# Patient Record
Sex: Female | Born: 1945 | Race: White | Hispanic: No | Smoking: Former smoker
Health system: Southern US, Community
[De-identification: ages and names within clinical notes are randomized; demographics above are authoritative.]

## PROBLEM LIST (undated history)

## (undated) DIAGNOSIS — I739 Peripheral vascular disease, unspecified: Secondary | ICD-10-CM

## (undated) DIAGNOSIS — K5790 Diverticulosis of intestine, part unspecified, without perforation or abscess without bleeding: Secondary | ICD-10-CM

## (undated) DIAGNOSIS — I10 Essential (primary) hypertension: Secondary | ICD-10-CM

## (undated) DIAGNOSIS — Z9981 Dependence on supplemental oxygen: Secondary | ICD-10-CM

## (undated) DIAGNOSIS — J449 Chronic obstructive pulmonary disease, unspecified: Secondary | ICD-10-CM

## (undated) DIAGNOSIS — E119 Type 2 diabetes mellitus without complications: Secondary | ICD-10-CM

## (undated) DIAGNOSIS — I2510 Atherosclerotic heart disease of native coronary artery without angina pectoris: Secondary | ICD-10-CM

## (undated) DIAGNOSIS — F419 Anxiety disorder, unspecified: Secondary | ICD-10-CM

## (undated) DIAGNOSIS — I219 Acute myocardial infarction, unspecified: Secondary | ICD-10-CM

## (undated) DIAGNOSIS — R0600 Dyspnea, unspecified: Secondary | ICD-10-CM

## (undated) DIAGNOSIS — E785 Hyperlipidemia, unspecified: Secondary | ICD-10-CM

## (undated) HISTORY — DX: Hyperlipidemia, unspecified: E78.5

## (undated) HISTORY — PX: CORONARY ANGIOPLASTY: SHX604

## (undated) HISTORY — PX: CORONARY ARTERY BYPASS GRAFT: SHX141

## (undated) HISTORY — DX: Chronic obstructive pulmonary disease, unspecified: J44.9

## (undated) HISTORY — DX: Peripheral vascular disease, unspecified: I73.9

## (undated) HISTORY — PX: ABDOMINAL HYSTERECTOMY: SHX81

## (undated) HISTORY — DX: Type 2 diabetes mellitus without complications: E11.9

## (undated) HISTORY — PX: CAROTID ENDARTERECTOMY: SUR193

## (undated) HISTORY — DX: Acute myocardial infarction, unspecified: I21.9

## (undated) HISTORY — DX: Essential (primary) hypertension: I10

## (undated) HISTORY — PX: TUBAL LIGATION: SHX77

## (undated) HISTORY — PX: HEMORRHOID SURGERY: SHX153

---

## 1998-08-10 DIAGNOSIS — I219 Acute myocardial infarction, unspecified: Secondary | ICD-10-CM

## 1998-08-10 HISTORY — DX: Acute myocardial infarction, unspecified: I21.9

## 2006-05-26 ENCOUNTER — Ambulatory Visit: Payer: Self-pay | Admitting: Family Medicine

## 2007-03-08 ENCOUNTER — Ambulatory Visit: Payer: Self-pay | Admitting: Internal Medicine

## 2007-03-10 ENCOUNTER — Ambulatory Visit: Payer: Self-pay | Admitting: Gastroenterology

## 2007-03-12 ENCOUNTER — Inpatient Hospital Stay: Payer: Self-pay | Admitting: Unknown Physician Specialty

## 2007-03-12 ENCOUNTER — Other Ambulatory Visit: Payer: Self-pay

## 2007-03-15 ENCOUNTER — Ambulatory Visit: Payer: Self-pay | Admitting: Gastroenterology

## 2007-04-20 ENCOUNTER — Ambulatory Visit: Payer: Self-pay | Admitting: Family Medicine

## 2007-06-02 ENCOUNTER — Ambulatory Visit: Payer: Self-pay | Admitting: Family Medicine

## 2009-07-01 ENCOUNTER — Emergency Department: Payer: Self-pay | Admitting: Emergency Medicine

## 2010-11-19 ENCOUNTER — Emergency Department: Payer: Self-pay | Admitting: Emergency Medicine

## 2011-02-03 ENCOUNTER — Ambulatory Visit: Payer: Self-pay | Admitting: Family Medicine

## 2011-02-06 ENCOUNTER — Ambulatory Visit: Payer: Self-pay | Admitting: Family Medicine

## 2011-03-05 ENCOUNTER — Ambulatory Visit: Payer: Self-pay | Admitting: Surgery

## 2012-02-19 DIAGNOSIS — I10 Essential (primary) hypertension: Secondary | ICD-10-CM | POA: Insufficient documentation

## 2012-02-19 DIAGNOSIS — Z720 Tobacco use: Secondary | ICD-10-CM | POA: Insufficient documentation

## 2012-02-19 DIAGNOSIS — E669 Obesity, unspecified: Secondary | ICD-10-CM | POA: Insufficient documentation

## 2012-02-19 DIAGNOSIS — I2510 Atherosclerotic heart disease of native coronary artery without angina pectoris: Secondary | ICD-10-CM | POA: Insufficient documentation

## 2012-02-19 DIAGNOSIS — I739 Peripheral vascular disease, unspecified: Secondary | ICD-10-CM | POA: Insufficient documentation

## 2012-02-19 DIAGNOSIS — E785 Hyperlipidemia, unspecified: Secondary | ICD-10-CM | POA: Insufficient documentation

## 2012-03-01 ENCOUNTER — Ambulatory Visit: Payer: Self-pay | Admitting: Family Medicine

## 2012-06-24 ENCOUNTER — Ambulatory Visit: Payer: Self-pay | Admitting: Family Medicine

## 2012-07-28 ENCOUNTER — Inpatient Hospital Stay: Payer: Self-pay | Admitting: Internal Medicine

## 2012-07-28 LAB — COMPREHENSIVE METABOLIC PANEL
Alkaline Phosphatase: 91 U/L (ref 50–136)
Anion Gap: 8 (ref 7–16)
Calcium, Total: 9 mg/dL (ref 8.5–10.1)
Co2: 33 mmol/L — ABNORMAL HIGH (ref 21–32)
EGFR (African American): 35 — ABNORMAL LOW
EGFR (Non-African Amer.): 30 — ABNORMAL LOW
Glucose: 121 mg/dL — ABNORMAL HIGH (ref 65–99)
Osmolality: 282 (ref 275–301)
SGOT(AST): 39 U/L — ABNORMAL HIGH (ref 15–37)
SGPT (ALT): 23 U/L (ref 12–78)
Sodium: 136 mmol/L (ref 136–145)

## 2012-07-28 LAB — CBC
HCT: 38.7 % (ref 35.0–47.0)
MCH: 28.3 pg (ref 26.0–34.0)
MCV: 81 fL (ref 80–100)
Platelet: 191 10*3/uL (ref 150–440)
RBC: 4.77 10*6/uL (ref 3.80–5.20)
RDW: 16.6 % — ABNORMAL HIGH (ref 11.5–14.5)
WBC: 5.8 10*3/uL (ref 3.6–11.0)

## 2012-07-28 LAB — CK TOTAL AND CKMB (NOT AT ARMC)
CK, Total: 262 U/L — ABNORMAL HIGH (ref 21–215)
CK-MB: <0.5 ng/mL — ABNORMAL LOW (ref 0.5–3.6)

## 2012-07-28 LAB — TROPONIN I: Troponin-I: 0.24 ng/mL — ABNORMAL HIGH

## 2012-07-29 LAB — CBC WITH DIFFERENTIAL/PLATELET
Basophil #: 0 10*3/uL (ref 0.0–0.1)
Basophil %: 0.2 %
Eosinophil #: 0.1 10*3/uL (ref 0.0–0.7)
Eosinophil %: 1.6 %
HCT: 34.8 % — ABNORMAL LOW (ref 35.0–47.0)
HGB: 11.8 g/dL — ABNORMAL LOW (ref 12.0–16.0)
Lymphocyte #: 2.1 10*3/uL (ref 1.0–3.6)
MCH: 27.5 pg (ref 26.0–34.0)
MCHC: 34 g/dL (ref 32.0–36.0)
MCV: 81 fL (ref 80–100)
Monocyte #: 0.6 x10 3/mm (ref 0.2–0.9)
Neutrophil %: 53.1 %
Platelet: 162 10*3/uL (ref 150–440)
RBC: 4.3 10*6/uL (ref 3.80–5.20)

## 2012-07-29 LAB — BASIC METABOLIC PANEL
Anion Gap: 6 — ABNORMAL LOW (ref 7–16)
BUN: 35 mg/dL — ABNORMAL HIGH (ref 7–18)
Calcium, Total: 8.5 mg/dL (ref 8.5–10.1)
Chloride: 99 mmol/L (ref 98–107)
Co2: 33 mmol/L — ABNORMAL HIGH (ref 21–32)
EGFR (African American): 40 — ABNORMAL LOW
Osmolality: 285 (ref 275–301)
Potassium: 3.5 mmol/L (ref 3.5–5.1)

## 2012-07-29 LAB — TROPONIN I: Troponin-I: 0.23 ng/mL — ABNORMAL HIGH

## 2012-07-29 LAB — APTT: Activated PTT: 141.2 secs — ABNORMAL HIGH (ref 23.6–35.9)

## 2012-07-29 LAB — LIPID PANEL: HDL Cholesterol: 31 mg/dL — ABNORMAL LOW (ref 40–60)

## 2012-07-29 LAB — CK-MB: CK-MB: 0.6 ng/mL (ref 0.5–3.6)

## 2012-07-30 LAB — BASIC METABOLIC PANEL
Anion Gap: 7 (ref 7–16)
BUN: 21 mg/dL — ABNORMAL HIGH (ref 7–18)
Chloride: 103 mmol/L (ref 98–107)
Creatinine: 1.25 mg/dL (ref 0.60–1.30)
EGFR (Non-African Amer.): 45 — ABNORMAL LOW
Glucose: 151 mg/dL — ABNORMAL HIGH (ref 65–99)
Osmolality: 285 (ref 275–301)
Potassium: 3.5 mmol/L (ref 3.5–5.1)
Sodium: 140 mmol/L (ref 136–145)

## 2012-07-30 LAB — CBC WITH DIFFERENTIAL/PLATELET
Basophil %: 0.1 %
Eosinophil %: 0 %
HCT: 34.6 % — ABNORMAL LOW (ref 35.0–47.0)
Lymphocyte #: 1.1 10*3/uL (ref 1.0–3.6)
MCH: 27 pg (ref 26.0–34.0)
MCV: 81 fL (ref 80–100)
Monocyte #: 0.3 x10 3/mm (ref 0.2–0.9)
Monocyte %: 5.8 %
Neutrophil #: 3.7 10*3/uL (ref 1.4–6.5)
Neutrophil %: 72.6 %
Platelet: 160 10*3/uL (ref 150–440)

## 2012-08-03 LAB — CULTURE, BLOOD (SINGLE)

## 2012-09-30 ENCOUNTER — Ambulatory Visit: Payer: Self-pay | Admitting: Specialist

## 2013-02-14 DIAGNOSIS — N289 Disorder of kidney and ureter, unspecified: Secondary | ICD-10-CM | POA: Insufficient documentation

## 2013-04-17 ENCOUNTER — Emergency Department: Payer: Self-pay | Admitting: Emergency Medicine

## 2013-09-01 LAB — CBC WITH DIFFERENTIAL/PLATELET
BASOS ABS: 0.2 10*3/uL — AB (ref 0.0–0.1)
BASOS PCT: 2.3 %
EOS ABS: 0.1 10*3/uL (ref 0.0–0.7)
EOS PCT: 1.7 %
HCT: 40.8 % (ref 35.0–47.0)
HGB: 13.6 g/dL (ref 12.0–16.0)
Lymphocyte #: 3.5 10*3/uL (ref 1.0–3.6)
Lymphocyte %: 42.2 %
MCH: 28.8 pg (ref 26.0–34.0)
MCHC: 33.5 g/dL (ref 32.0–36.0)
MCV: 86 fL (ref 80–100)
Monocyte #: 0.9 x10 3/mm (ref 0.2–0.9)
Monocyte %: 10.4 %
Neutrophil #: 3.6 10*3/uL (ref 1.4–6.5)
Neutrophil %: 43.4 %
Platelet: 269 10*3/uL (ref 150–440)
RBC: 4.73 10*6/uL (ref 3.80–5.20)
RDW: 16.3 % — AB (ref 11.5–14.5)
WBC: 8.2 10*3/uL (ref 3.6–11.0)

## 2013-09-01 LAB — URINALYSIS, COMPLETE
Bacteria: NONE SEEN
Bilirubin,UR: NEGATIVE
Blood: NEGATIVE
Glucose,UR: NEGATIVE mg/dL (ref 0–75)
Ketone: NEGATIVE
Leukocyte Esterase: NEGATIVE
NITRITE: NEGATIVE
PH: 6 (ref 4.5–8.0)
PROTEIN: NEGATIVE
RBC,UR: 1 /HPF (ref 0–5)
Specific Gravity: 1.008 (ref 1.003–1.030)
WBC UR: 1 /HPF (ref 0–5)

## 2013-09-01 LAB — BASIC METABOLIC PANEL
Anion Gap: 4 — ABNORMAL LOW (ref 7–16)
BUN: 34 mg/dL — ABNORMAL HIGH (ref 7–18)
CHLORIDE: 101 mmol/L (ref 98–107)
CO2: 35 mmol/L — AB (ref 21–32)
Calcium, Total: 8.9 mg/dL (ref 8.5–10.1)
Creatinine: 1.84 mg/dL — ABNORMAL HIGH (ref 0.60–1.30)
GFR CALC AF AMER: 33 — AB
GFR CALC NON AF AMER: 28 — AB
Glucose: 112 mg/dL — ABNORMAL HIGH (ref 65–99)
OSMOLALITY: 288 (ref 275–301)
POTASSIUM: 3.4 mmol/L — AB (ref 3.5–5.1)
Sodium: 140 mmol/L (ref 136–145)

## 2013-09-01 LAB — TROPONIN I
TROPONIN-I: 0.02 ng/mL
Troponin-I: <0.02 ng/mL

## 2013-09-02 ENCOUNTER — Observation Stay: Payer: Self-pay | Admitting: Internal Medicine

## 2013-10-04 ENCOUNTER — Ambulatory Visit: Payer: Self-pay | Admitting: Family Medicine

## 2014-08-14 DIAGNOSIS — J449 Chronic obstructive pulmonary disease, unspecified: Secondary | ICD-10-CM | POA: Diagnosis not present

## 2014-08-27 DIAGNOSIS — J449 Chronic obstructive pulmonary disease, unspecified: Secondary | ICD-10-CM | POA: Diagnosis not present

## 2014-09-11 DIAGNOSIS — E785 Hyperlipidemia, unspecified: Secondary | ICD-10-CM | POA: Diagnosis not present

## 2014-09-11 DIAGNOSIS — I10 Essential (primary) hypertension: Secondary | ICD-10-CM | POA: Diagnosis not present

## 2014-09-11 DIAGNOSIS — R0602 Shortness of breath: Secondary | ICD-10-CM | POA: Diagnosis not present

## 2014-09-11 DIAGNOSIS — R350 Frequency of micturition: Secondary | ICD-10-CM | POA: Diagnosis not present

## 2014-09-14 DIAGNOSIS — J449 Chronic obstructive pulmonary disease, unspecified: Secondary | ICD-10-CM | POA: Diagnosis not present

## 2014-09-27 DIAGNOSIS — J449 Chronic obstructive pulmonary disease, unspecified: Secondary | ICD-10-CM | POA: Diagnosis not present

## 2014-10-04 DIAGNOSIS — E780 Pure hypercholesterolemia: Secondary | ICD-10-CM | POA: Diagnosis not present

## 2014-10-04 DIAGNOSIS — J449 Chronic obstructive pulmonary disease, unspecified: Secondary | ICD-10-CM | POA: Insufficient documentation

## 2014-10-04 DIAGNOSIS — I10 Essential (primary) hypertension: Secondary | ICD-10-CM | POA: Diagnosis not present

## 2014-10-04 DIAGNOSIS — I25119 Atherosclerotic heart disease of native coronary artery with unspecified angina pectoris: Secondary | ICD-10-CM | POA: Diagnosis not present

## 2014-10-04 DIAGNOSIS — I739 Peripheral vascular disease, unspecified: Secondary | ICD-10-CM | POA: Diagnosis not present

## 2014-10-05 ENCOUNTER — Ambulatory Visit: Payer: Self-pay | Admitting: Family Medicine

## 2014-10-05 DIAGNOSIS — Z1231 Encounter for screening mammogram for malignant neoplasm of breast: Secondary | ICD-10-CM | POA: Diagnosis not present

## 2014-10-13 DIAGNOSIS — J449 Chronic obstructive pulmonary disease, unspecified: Secondary | ICD-10-CM | POA: Diagnosis not present

## 2014-10-26 DIAGNOSIS — J449 Chronic obstructive pulmonary disease, unspecified: Secondary | ICD-10-CM | POA: Diagnosis not present

## 2014-11-07 DIAGNOSIS — N289 Disorder of kidney and ureter, unspecified: Secondary | ICD-10-CM | POA: Diagnosis not present

## 2014-11-07 DIAGNOSIS — Z951 Presence of aortocoronary bypass graft: Secondary | ICD-10-CM | POA: Diagnosis not present

## 2014-11-07 DIAGNOSIS — Z720 Tobacco use: Secondary | ICD-10-CM | POA: Diagnosis not present

## 2014-11-07 DIAGNOSIS — I517 Cardiomegaly: Secondary | ICD-10-CM | POA: Diagnosis not present

## 2014-11-07 DIAGNOSIS — J449 Chronic obstructive pulmonary disease, unspecified: Secondary | ICD-10-CM | POA: Diagnosis not present

## 2014-11-07 DIAGNOSIS — J9611 Chronic respiratory failure with hypoxia: Secondary | ICD-10-CM | POA: Insufficient documentation

## 2014-11-13 DIAGNOSIS — J449 Chronic obstructive pulmonary disease, unspecified: Secondary | ICD-10-CM | POA: Diagnosis not present

## 2014-11-26 DIAGNOSIS — J449 Chronic obstructive pulmonary disease, unspecified: Secondary | ICD-10-CM | POA: Diagnosis not present

## 2014-11-27 NOTE — Discharge Summary (Signed)
PATIENT NAME:  [name], [name] MR#:  [id number] DATE OF BIRTH:  1945/11/21  DATE OF ADMISSION:  07/28/2012 DATE OF DISCHARGE:  07/30/2012  CHIEF COMPLAINT: Chest pain.   CONSULTANT: Dr. Ubaldo Glassing from cardiology.   PRIMARY CARE PHYSICIAN: Dr. Kary Kos, primary cardiologist at [facility].   DISCHARGE DIAGNOSES: 1.  Non-ST elevation myocardial infarction.  2. Mild wheezing, likely from bronchitis/mild chronic obstructive pulmonary disease exacerbation.  3.  Hypertension.  4.  History of chronic obstructive pulmonary disease, on oxygen.  5.  Coronary artery disease, status post coronary artery bypass graft with stent placements in the past.  6.  Hyperlipidemia.  7.  Peripheral vascular disease.   DISCHARGE MEDICATIONS: 1.  Clopidogrel 75 mg 1 tab daily.  2.  Torsemide 20 mg daily.  3.  Aspirin 81 mg daily.  4.  Advair 100 mcg/25 mcg inhaled powder 1 puff once a day.  5.  Spiriva 18 mcg inhaled 1 cap daily.  6.  Vitamin D3, a 50,000 international units oral capsule 1 cap once a day.  7.  Folic acid 0.8 mg 1 cap once a day. 8.  Cymbalta 60 mg daily. 9.  Fish oil 1000 mg daily 1 cap.  10.  Metoprolol succinate 200 mg 1 tab once a day.  11.  Omega Krill 300 mg once a day.  12.  Ferrous sulfate 325 mg once a day.  13.  Imdur 30 mg 1 tab once a day.  14.  Nitroglycerin sublingual p.r.n., 0.4 mg, 1 tab as needed every five minutes x 3 for chest pain.  15.  Simvastatin 20 mg once a day.  16.  Levaquin 500 mg 1 tab every 24 hours.   DISCHARGE INSTRUCTIONS:  The patient will be going home with 2 liters nasal cannula. Low sodium, low fat, low cholesterol diet. Follow with your pulmonologist and cardiologist within 1 to 2 weeks. Please follow with primary care physician within 1 to 2 weeks.   HISTORY OF PRESENT ILLNESS: For full details of the history and physical, please see the dictation on the 19th by Dr. Margaretmary Eddy.  This is a [name] with the above chief complaint who has history of  CAD status post bypass in 1992 at [facility]; COPD, on 2 liters of oxygen on p.r.n. basis, hypertension, hyperlipidemia, who presented to the hospital. She also did have some productive cough, was bringing up some clear sputum. She also had some low-grade fevers. On arrival, she was noted to have elevated troponin and there were some EKG changes on admission. The case was discussed with cardiology, Dr. Ubaldo Glassing, and the patient was admitted to the hospitalist service.   SIGNIFICANT LABS AND IMAGING: Initial creatinine 1.75, by discharge 1.25; initial BUN 38, initial potassium 2.9, chloride 95. Initial troponin 0.24, then 0.23, then 0.17. Initial CK total was 262 and CK-MBs were not elevated x 3. Initial WBC 5.8, hemoglobin 13.5. Blood cultures no growth to date. The patient underwent cardiac catheterization on July 29, 2012, which showed multivessel disease with proximal LAD there was  100% stenosis, distal LAD moderate atherosclerosis, proximal circ 70% stenosis at the site of prior stent, first obtuse marginal 70% stenosis and second lesion there is 70% stenosis, proximal RCA there is 100% stenosis, 100% stenosis at the proximal anastomosis graft. There is 100% stenosis at the proximal anastomosis on the graft to the RPDA. X-ray of the chest, PA and lateral, on arrival showing interstitial marking of both lungs are increased coupled with the appearance of cardiac silhouette  and pulmonary vascular congestion suggesting low-grade CHF. No focal pneumonia but cannot exclude acute bronchitis; possible underlying COPD suspected.   HOSPITAL COURSE: The patient was admitted to the hospitalist service, started on aspirin and Plavix, her beta blocker was continued. She was admitted to CCU. The patient was seen by Dr. Ubaldo Glassing from cardiology and the patient underwent a cardiac cath, the results of which are above. The images were shared with the physicians at [facility] and at this point medical management is recommended. LV gram not  done secondary to renal function. The patient appears to have CKD  at least stage III. She did not have any further chest pains after the night of admission. Her LDL goal has been met, her LDL is 57. Therefore, she was started on Imdur 30 mg. Her other medications will be continued. She should follow with her primary cardiologist for further intervention and evaluation. She did have mild wheezing on exam with recent URI symptoms, was started on the Levaquin. She does not have pneumonia but she likely has bronchitis with  mild COPD exacerbation and was also given several doses of IV Solu-Medrol. Her symptoms are better and she was encouraged to wear her oxygen in the near future to increase oxygen supplied to the myocardium. At this point, she will be discharged with outpatient followup.     ____________________________ Vivien Presto, MD sa:cs D: 07/30/2012 13:41:00 ET T: 07/31/2012 19:37:44 ET JOB#: [id number]  cc: Vivien Presto, MD, <Dictator> Irven Easterly. Kary Kos, MD Vivien Presto MD ELECTRONICALLY SIGNED 08/09/2012 14:12

## 2014-11-27 NOTE — H&P (Signed)
PATIENT NAME:  [name], [name] MR#:  [id number] DATE OF BIRTH:  1946-04-20  DATE OF ADMISSION:  07/28/2012  PRIMARY CARE PHYSICIAN: Jerl MinaJames Hedrick, MD.   PRIMARY CARDIOLOGIST: At [facility]. The patient was seen by Dr. Lady GaryFath at [facility] in the past as well.   CHIEF COMPLAINT: Shortness of breath, cough and chest tightness.   HISTORY OF PRESENT ILLNESS: The patient is a 69 year old Caucasian female with a past medical history of coronary artery disease, status post coronary artery [facility] and recent history of stent placement at [facility] 3 years ago, COPD, 2 liters of oxygen dependent on as needed basis, hypertension, hyperlipidemia and peripheral vascular disease with 2 stent placement in the past, is presenting to the ER with a chief complaint of shortness of breath associated with chest tightness in the middle of the chest since Sunday. This is associated with a productive cough, and she is bringing up clear phlegm. She denies any sick contacts, but she feels like she has a low-grade temperature. She did not get any flu shot this year. The patient tried over-the-counter medications for cold and cough with no significant improvement. Today, the chest tightness became worse and the patient feels like there is somebody sitting in the middle of her chest, associated with some diaphoresis and lightheadedness. Eventually, the patient came into the ER. An initial chest x-ray has revealed early developing infiltrate in the right lower lobe. Cardiac enzymes are elevated. Initial troponin was at 0.24. A 12-lead EKG has revealed ST depressions in the inferior leads II, III and aVF. The patient has received 1 breathing treatment, as well as Levaquin IV after blood cultures were obtained in the ER. The patient was also started on heparin drip after given bolus in the ER. Hospitalist team is called to admit the patient for NSTEMI, as well as early developing  pneumonia. During my examination, the patient started feeling better and reporting that her chest tightness is significantly improved and she did not feel any chest pain. Shortness of breath is much better during my examination. The patient's son and her boyfriend are at bedside during my exam. The patient is aware of the elevated troponin and EKG changes and felt comfortable. Will stay at [facility], though initially they were thinking of getting [facility]. I have called Dr. Lady GaryFath, cardiology group, and notified about the patient's clinical condition as well as lab work.    PAST MEDICAL HISTORY: Coronary artery disease status post coronary artery [facility] and 2 stent placement 3 years ago. Hypertension, hyperlipidemia, COPD O2 dependent. She uses 2 liters of oxygen on an as needed basis. Peripheral vascular disease.   PAST SURGICAL HISTORY: Status post coronary artery bypass grafting in 1992, status post 2 stent [facility] years ago, peripheral vascular disease stent placement a few years ago, hysterectomy for fibroids.   ALLERGIES: She has no known drug allergies.   HOME MEDICATIONS: Torsemide 20 mg once daily for swelling (denies any congestive heart failure), Spiriva 18 mcg 1 puff inhalation once a day, metoprolol succinate 200 mg once a day, folic acid 0.8 mg p.o. once a day, fish oil 1000 mg once a day, iron sulfate 325 mg once a day, vitamin D3 50,000 international units 1 capsule once a day, Cymbalta 60 mg once a day, clopidogrel 75 mg 1 tablet once a day, aspirin 81 mg once a day.  PSYCHOSOCIAL HISTORY: Lives at home with boyfriend and pet. She used to smoke but quit smoking in October 2013. Denies any alcohol or illicit drug usage.   FAMILY HISTORY: Dad had history of heart attack at age 56, also he had history of hypertension.   REVIEW OF SYSTEMS:  CONSTITUTIONAL: Complaining of low-grade fever,  weakness but denies any weight loss or weight gain.  EYES: Denies any blurry vision, glaucoma, cataracts, redness.  ENT: Denies tinnitus, ear pain, ear discharge. Complaining of nostril stuffiness. Denies any snoring. Denies any postnasal drip. Denies any difficulty swallowing.  RESPIRATORY: Positive productive cough. Denies any wheezing or hemoptysis. Chronic history of COPD. Complaining of shortness of breath. She denies asthma or painful respiration.  CARDIOVASCULAR: Denies any chest pain during my examination. Denies palpitations, syncope, varicose veins or edema.  GASTROINTESTINAL: Denies nausea, vomiting, diarrhea, abdominal pain or melena. No hematemesis.  GENITOURINARY: Denies dysuria, hematuria, renal calculus.  GYNECOLOGY: Denies any breast mass, vaginal discharge. The patient has history of hysterectomy.  ENDOCRINE: Denies polyuria, polyphagia, polydipsia. Denies any thyroid problems. Denies any increased sweating.  HEMATOLOGIC AND LYMPHATIC: Denies any lymph node swelling. Denies any anemia.  INTEGUMENTARY: Denies acne, rash, lesions.  MUSCULOSKELETAL: Denies any pain in the neck, back, shoulder, arthritis, gout.  NEUROLOGIC: Denies any vertigo, ataxia, weakness, dysarthria, dementia, migraine headache.  PSYCHIATRIC: Denies any anxiety, insomnia, bipolar disorder, obsessive-compulsive disorder.   PHYSICAL EXAMINATION:  VITAL SIGNS: Temperature 98.5, pulse 63, respiratory rate 24, blood pressure 113/68, pulse oximetry 92% to 93% on 2 liters.  GENERAL APPEARANCE: Not in acute distress, moderately built, moderately nourished, answering questions appropriately.  HEENT: Normocephalic, atraumatic. Pupils are equally reacting to light and accommodation. Extraocular movements are intact. No conjunctival injection. No scleral icterus. Nares are congested. No postnasal drip. No sinus tenderness. No ear discharge. Tympanic membranes are intact.  NECK: Supple. No JVD. No thyromegaly. The patient  has left-sided carotid endarterectomy scar, healed well. No carotid bruit is heard on the right.  LUNGS: Clear to auscultation bilaterally. No accessory muscle usage. No wheezing. No chest wall tenderness.  CARDIOVASCULAR: S1, S2 normal. Regular rate and rhythm. No pedal edema. Peripheral pulses are 2+.  GASTROINTESTINAL: Soft. Bowel sounds are positive in all 4 quadrants. Nontender, nondistended. No masses felt. No rebound tenderness.  NEUROLOGIC: Awake, alert and oriented x3. Cranial nerves II through XII grossly intact. Motor and sensory are intact. Reflexes are 2+.  SKIN: No lesions. No acne. No rashes.  MUSCULOSKELETAL: No joint effusion, tenderness or erythema noticed. Range of motion is grossly intact.  EXTREMITIES: No cyanosis. No edema. No clubbing.   LABS AND IMAGING STUDIES: A 12-lead EKG revealed ST depressions in leads II, III and aVF. PR interval is prolonged at 224 ms which is indicating first-degree AV block. Chest x-ray: Developing early right lower lobe infiltrate. Glucose is 121, sodium 136, potassium 2.9, chloride 95, CO2 33. GFR is 30. Anion gap is 8. Osmolality 282. BUN 38, creatinine 1.75. Albumin 3.1, bilirubin total 2.7, alkaline phosphatase 91, AST 39, ALT is 23. CK total 262. CPK-MB less than 0.5. Troponin 0.24. WBC 5.8, hemoglobin 13.1, hematocrit 38.7, platelet count is 191,000. Activated PTT 30.4.   ASSESSMENT AND PLAN: A 69 year old Caucasian female presenting to the ER with a 3 day history of chest tightness, shortness of breath and cough. Will be admitted with the following assessment and plan.  1. Chest tightness with elevated troponin and ST depressions in the inferior leads - Non-ST elevation myocardial infarction (NSTEMI). Admit to intensive care  unit. N.p.o. and provide her intravenous fluids. Acute coronary syndrome (ACS) protocol with oxygen, nitroglycerin, aspirin, beta blocker and statin. Will get 2-D echocardiogram. The patient is started on a heparin bolus  followed by a heparin drip. Dr. Lady Gary, cardiology, is called and notified about the patient and consult. He is aware. Will cycle cardiac biomarkers. Will get an EKG in a.m.  2. Hypokalemia: Will replace and check a basic metabolic panel in the a.m.  3. Acute kidney injury: Will provide gentle hydration with intravenous fluids with close monitoring for fluid overload. Check Chem-8 in a.m.  4. Probably early developing pneumonia in the right lower lobe: Will give her Levaquin and pharmacy to dose.  5. Chronic history of hyperlipidemia: Check fasting lipid panel in a.m. and will continue statin.  6. Hypertension: Monitor blood pressure closely and the patient is on beta blocker. Titrate as needed.  7. Chronic history of chronic obstructive pulmonary disease, oxygen dependent: Will continue 2 liters of oxygen and titrate on as needed basis.  8. Gastrointestinal prophylaxis with Protonix.  9. Deep vein thrombosis prophylaxis is not needed as the patient is on heparin drip.   CODE STATUS: She is FULL CODE.   The diagnosis and plan of care was discussed in detail with the patient and her family members at bedside. They all verbalized understanding of the plan. Total critical care time spent is 60 minutes.    ____________________________ Ramonita Lab, MD ag:gb D: 07/29/2012 00:15:00 ET T: 07/29/2012 03:02:25 ET JOB#: [id number]  cc: Rhona Leavens. Burnett Sheng, MD Darlin Priestly Lady Gary, MD Ramonita Lab, MD, <Dictator> Ramonita Lab MD ELECTRONICALLY SIGNED 07/31/2012 6:19

## 2014-11-30 NOTE — Discharge Summary (Signed)
PATIENT NAME:  Taylor HatcherKING, Doreather J MR#:  [id number] DATE OF BIRTH:  May 29, 1946  DATE OF ADMISSION:  07/28/2012 DATE OF DISCHARGE:  07/30/2012  ADDENDUM: The patient was not discharged with an ACE or an ARB at the time of discharge. The echo result was pending.  ____________________________ Krystal EatonShayiq Johnryan Sao, MD sa:sb D: 08/29/2012 12:13:43 ET T: 08/29/2012 12:50:28 ET JOB#: [id number]  cc: Krystal EatonShayiq Pattijo Juste, MD, <Dictator> Krystal EatonSHAYIQ Nykia Turko MD ELECTRONICALLY SIGNED 08/30/2012 19:45

## 2014-12-01 NOTE — Discharge Summary (Signed)
PATIENT NAME:  [name], [name] MR#:  [id number] DATE OF BIRTH:  1946-02-20  DATE OF ADMISSION:  09/02/2013 DATE OF DISCHARGE:  09/02/2013  PRESENTING COMPLAINT: Weakness and low blood pressure.   DISCHARGE DIAGNOSES: 1. Acute hypotension secondary to combination of blood pressure meds and mild acute renal failure. No recent change in her.  2. History of hypertension. Stable now.  3. Chronic obstructive pulmonary disease.   CONDITION ON DISCHARGE: Fair.   CODE STATUS: FULL CODE.   DIET: 2 grams sodium.   MEDICATIONS AT DISCHARGE: 1. Plavix 75 mg daily.  2. Aspirin 81 mg daily.  3. Vitamin D3 50,000 international units p.o. daily.  4. Folic acid 0.8 kg daily.  5. Fish oil 1000 mg p.o. daily.  6. Metoprolol 200 mg p.o. daily.  7. Simvastatin 20 mg at bedtime.  8. Ferrous sulfate 325 mg p.o. daily.  9. Torsemide 20 mg p.o. daily. The patient is advised to start taking it in 2-3 days Follow up with Dr. Burnett ShengHedrick in 1 to 2 weeks.  LABORATORIES AT DISCHARGE: Chest x-ray shows minimal bilateral atelectasis. Cardiac enzymes negative. CBC within normal limits. Creatinine is 1.84, sodium is 140, potassium is 3.4, chloride is 101, bicarbonate is 35. Urinalysis negative for UTI.   BRIEF SUMMARY OF HOSPITAL COURSE: Taylor DraftsSandra Donaldson is a 69 year old Caucasian female, comes to the Emergency Room after she started feeling weak, was found to have hypotension. She was admitted with:  1. Acute hypertension, likely secondary to combination of blood pressure meds and mild acute renal failure. No recent change in her blood pressure medications. No signs of any infection. The patient did show good response to IV fluids. Her blood pressure meds were resumed back and she was asked to take her torsemide in the next couple of days. She tolerated diet well and ambulated well.  2. Hypertension. Stable now. Home meds resume.  3. Chronic obstructive pulmonary disease, mild exacerbation. The patient received a dose of  Solu-Medrol. No signs of infection. No wheezing, sats remained stable at room air.  4. Deep vein thrombosis prophylaxis with Lovenox. Hospital stay otherwise remained stable. The patient remained a FULL CODE. She will follow up with Dr. Burnett ShengHedrick as outpatient.   TIME SPENT: 40 minutes.   ____________________________ Wylie HailSona A. Allena KatzPatel, MD sap:sg D: 09/03/2013 07:01:08 ET T: 09/03/2013 07:10:30 ET JOB#: [id number]  cc: Apolinar Bero A. Allena KatzPatel, MD, <Dictator> Willow OraSONA A Leeann Bady MD ELECTRONICALLY SIGNED 09/03/2013 8:39

## 2014-12-01 NOTE — H&P (Signed)
PATIENT NAME:  Taylor HatcherKING, [name] DATE OF BIRTH:  02-16-1946  DATE OF ADMISSION:  09/02/2013  PRIMARY CARE PHYSICIAN:  Dr. Jerl MinaJames Hedrick.   REFERRING PHYSICIAN:  Dr. Shaune PollackLord.  CHIEF COMPLAINT:  Dizziness.   HISTORY OF PRESENT ILLNESS:  [name] is a 69 year old female with history of hypertension, hyperlipidemia, coronary artery disease, status post coronary artery bypass grafting, started to experience the dizziness since yesterday.  Checked her blood pressure, was found to have blood pressure in the 70s.  However, overnight, blood pressure improved, but again this morning noticed to have low blood pressure.  Concerning this, the patient is brought to the Emergency Department.  The patient's initial systolic blood pressure was in the 80s.  The patient received one liter of IV fluids with significant improvement with the blood pressure.  Current blood pressure is 148/85.  Currently denies having any symptoms.  There are no obvious signs of any infection are noted with a normal  and chest x-ray.  The patient denied having any chest pain, palpitations.  The patient is noted to have elevated BUN and creatinine of 34 and 1.84.  This is a significant worsening from the baseline of normal creatinine.  The patient denies any recent nausea, vomiting and diarrhea.  Denies any change in her medication.  The patient is an extremely poor historian; however, the patient's son emphasizes that the patient's diet and fluid intake has significantly decreased for which the patient denies.   PAST MEDICAL HISTORY: 1.  Coronary artery disease status post CABG.  2.  Hypertension.  3.  Hyperlipidemia.  4.  Hysterectomy.  5.  Coronary artery bypass grafting.   ALLERGIES:  No known drug allergies.   HOME MEDICATIONS: 1.  Torsemide 20 mg once a day.  2.  Simvastatin 20 mg once a day.  3.  Metoprolol 200 mg once a day.  4.  Folic acid 0.8 mg once a day.  5.  Fish oil 1000 mg once a day.  6.  Ferrous sulfate  325 mg once a day.  7.  Vitamin D3 50,000 units once a day.  8.  Plavix 75 mg once a day.  9.  Aspirin 81 mg once a day.   SOCIAL HISTORY:  Former smoker, quit one week back.  Denies drinking alcohol or using illicit drugs.  Lives by herself.   FAMILY HISTORY:  Hypertension.   REVIEW OF SYSTEMS:  CONSTITUTIONAL:  Denies any generalized weakness.  EYES:  No change in vision.  EARS, NOSE, THROAT:  No change in hearing.  RESPIRATORY:  Has some cough with mild productive sputum.  CARDIOVASCULAR:  No chest pain, palpitations.  GASTROINTESTINAL:  No nausea, vomiting, abdominal pain.  GENITOURINARY:  No dysuria or hematuria.  HEMATOLOGY:  No easy bruising or bleeding.  SKIN:  No rashes or lesions.  MUSCULOSKELETAL:  No joint pains and aches.  NEUROLOGIC:  No weakness or numbness in any part of the body.   PHYSICAL EXAMINATION: GENERAL:  This is a well-built, well-nourished, age-appropriate female lying down in the bed, not in distress.  VITAL SIGNS:  Temperature 96.1, pulse 102, blood pressure 148/85, respiratory rate of 20, oxygen saturation is 92% on 2 liters of oxygen.  HEENT:  Head normocephalic, atraumatic.  Eyes, no scleral icterus.  Conjunctivae normal.  Pupils equal and react to light.  Extraocular movements are intact.  Mucous membranes:  Mild dryness.  No pharyngeal erythema.  NECK:  Supple.  No lymphadenopathy.  No JVD.  No  carotid bruit.  CHEST:  Has no focal tenderness.  Bilateral diffuse wheezing.  HEART:  S1, S2 regular.  No murmurs are heard.  ABDOMEN:  Bowel sounds plus.  Soft, nontender, nondistended.  No hepatosplenomegaly.  EXTREMITIES:  No pedal edema.  Pulses 2+.  NEUROLOGIC:  The patient is alert, oriented to place, person and time.  Cranial nerves II through XII intact.  Motor 5 by 5 in upper and lower extremities.  SKIN:  No rash or lesions.   LABORATORY DATA:  CMP:  BUN 34, creatinine of 1.84, potassium 3.4.  The rest of all the values are within normal limits.    CBC is completely within normal limits.   UA negative for nitrites and leukocyte esterase.  Troponin less than 0.2 x 2.  Chest x-ray, one view portable:  No acute cardiopulmonary disease.   ASSESSMENT AND PLAN:  [name] is a 69 year old female who comes to the Emergency Department with hypotension.  1.  Hypotension, most likely secondary to these medications.  The patient states has been compliant with her medication.  No recent change in her medication.  No signs of any infection is seen.  We will hold all blood pressure medications until blood pressure improves.  The patient has a good response to the IV fluids.  We will continue to follow up.  Introduce back with Norvasc at a small dose.  Hold the hydrochlorothiazide.  2.  Hypertension.  Holding all blood pressure medications.  3.  Chronic obstructive pulmonary disease exacerbation.  Continue with the breathing treatments, Solu-Medrol.  No signs of any infection.  No indication for antibiotics.  4.  Keep the patient on deep vein thrombosis prophylaxis with Lovenox.   TIME SPENT:  45 minutes.    ____________________________ Susa Griffins, MD pv:ea D: 09/02/2013 02:30:04 ET T: 09/02/2013 03:24:03 ET JOB#: [id number]  cc: Susa Griffins, MD, <Dictator> Rhona Leavens. Burnett Sheng, MD Clerance Lav Marycatherine Maniscalco MD ELECTRONICALLY SIGNED 09/03/2013 21:27

## 2014-12-13 DIAGNOSIS — J449 Chronic obstructive pulmonary disease, unspecified: Secondary | ICD-10-CM | POA: Diagnosis not present

## 2014-12-26 DIAGNOSIS — J449 Chronic obstructive pulmonary disease, unspecified: Secondary | ICD-10-CM | POA: Diagnosis not present

## 2015-01-13 DIAGNOSIS — J449 Chronic obstructive pulmonary disease, unspecified: Secondary | ICD-10-CM | POA: Diagnosis not present

## 2015-01-26 DIAGNOSIS — J449 Chronic obstructive pulmonary disease, unspecified: Secondary | ICD-10-CM | POA: Diagnosis not present

## 2015-02-07 DIAGNOSIS — J449 Chronic obstructive pulmonary disease, unspecified: Secondary | ICD-10-CM | POA: Diagnosis not present

## 2015-02-07 DIAGNOSIS — I10 Essential (primary) hypertension: Secondary | ICD-10-CM | POA: Diagnosis not present

## 2015-02-12 DIAGNOSIS — J449 Chronic obstructive pulmonary disease, unspecified: Secondary | ICD-10-CM | POA: Diagnosis not present

## 2015-02-25 DIAGNOSIS — J449 Chronic obstructive pulmonary disease, unspecified: Secondary | ICD-10-CM | POA: Diagnosis not present

## 2015-03-15 DIAGNOSIS — J449 Chronic obstructive pulmonary disease, unspecified: Secondary | ICD-10-CM | POA: Diagnosis not present

## 2015-03-19 ENCOUNTER — Ambulatory Visit: Payer: Commercial Managed Care - HMO

## 2015-03-25 DIAGNOSIS — J432 Centrilobular emphysema: Secondary | ICD-10-CM | POA: Diagnosis not present

## 2015-03-25 DIAGNOSIS — R918 Other nonspecific abnormal finding of lung field: Secondary | ICD-10-CM | POA: Diagnosis not present

## 2015-03-25 DIAGNOSIS — Z87891 Personal history of nicotine dependence: Secondary | ICD-10-CM | POA: Diagnosis not present

## 2015-03-28 DIAGNOSIS — J449 Chronic obstructive pulmonary disease, unspecified: Secondary | ICD-10-CM | POA: Diagnosis not present

## 2015-04-15 DIAGNOSIS — J449 Chronic obstructive pulmonary disease, unspecified: Secondary | ICD-10-CM | POA: Diagnosis not present

## 2015-04-23 ENCOUNTER — Encounter: Payer: Commercial Managed Care - HMO | Attending: Family Medicine | Admitting: Respiratory Therapy

## 2015-04-23 ENCOUNTER — Encounter: Payer: Self-pay | Admitting: Respiratory Therapy

## 2015-04-23 VITALS — Ht 67.0 in

## 2015-04-23 DIAGNOSIS — E119 Type 2 diabetes mellitus without complications: Secondary | ICD-10-CM | POA: Insufficient documentation

## 2015-04-23 DIAGNOSIS — J449 Chronic obstructive pulmonary disease, unspecified: Secondary | ICD-10-CM | POA: Diagnosis not present

## 2015-04-23 DIAGNOSIS — K649 Unspecified hemorrhoids: Secondary | ICD-10-CM | POA: Insufficient documentation

## 2015-04-23 DIAGNOSIS — I739 Peripheral vascular disease, unspecified: Secondary | ICD-10-CM | POA: Insufficient documentation

## 2015-04-23 NOTE — Progress Notes (Signed)
Pulmonary Individual Treatment Plan  Patient Details  Name: [name] MRN: [id number] Date of Birth: February 28, 1946 Referring Provider:  Jerl Mina, MD  Initial Encounter Date: Date: 04/23/15  Visit Diagnosis: COPD, moderate  Patient's Home Medications on Admission: No current outpatient prescriptions on file.  Past Medical History: Past Medical History  Diagnosis Date  . COPD (chronic obstructive pulmonary disease)   . Hyperlipidemia   . Hypertension   . Diabetes   . Peripheral vascular disease   . Myocardial infarction     Tobacco Use: History  Smoking status  . Former Smoker -- 2.00 packs/day for 47 years  . Types: Cigarettes  . Quit date: 08/11/2011  Smokeless tobacco  . Not on file    Labs: Recent Review Flowsheet Data    Labs for ITP Cardiac and Pulmonary Rehab Latest Ref Rng 07/29/2012   Cholestrol 0-200 mg/dL 409   LDLCALC 8-119 mg/dL 57   HDL 14-78 mg/dL 29(F)   Trlycerides 6-213 mg/dL 086       ADL UCSD:     ADL UCSD      04/23/15 1115       ADL UCSD   ADL Phase Entry     SOB Score total 78     Rest 0     Walk 3     Stairs 5     Bath 3     Dress 3     Shop 4         Pulmonary Function Assessment:     Pulmonary Function Assessment - 04/23/15 1115    Pulmonary Function Tests   RV% 117 %   DLCO% 42.5 %   Initial Spirometry Results   FVC% 70 %   FEV1% 45 %   FEV1/FVC Ratio 50   Post Bronchodilator Spirometry Results   FVC% 66.8 %   FEV1% 43.3 %   FEV1/FVC Ratio 50   Breath   Bilateral Breath Sounds Decreased;Clear   Shortness of Breath Yes      Exercise Target Goals: Date: 04/23/15  Exercise Program Goal: Individual exercise prescription set with THRR, safety & activity barriers. Participant demonstrates ability to understand and report RPE using BORG scale, to self-measure pulse accurately, and to acknowledge the importance of the exercise prescription.  Exercise Prescription Goal: Starting with aerobic activity  30 plus minutes a day, 3 days per week for initial exercise prescription. Provide home exercise prescription and guidelines that participant acknowledges understanding prior to discharge.  Activity Barriers & Risk Stratification:     Activity Barriers & Risk Stratification - 04/23/15 1115    Activity Barriers & Risk Stratification   Activity Barriers Deconditioning;Muscular Weakness;Shortness of Breath;Assistive Device   Risk Stratification Moderate      6 Minute Walk:     6 Minute Walk      04/23/15 1648       6 Minute Walk   Phase Initial     Distance 460 feet     Walk Time 5.5 minutes     Resting HR 68 bpm     Resting BP 134/82 mmHg     Max Ex. HR 79 bpm     Max Ex. BP 138/70 mmHg     RPE 18     Perceived Dyspnea  8     Symptoms No        Initial Exercise Prescription:     Initial Exercise Prescription - 04/23/15 1600    Date of Initial Exercise Prescription   Date 04/23/15  Treadmill   MPH 1   Grade 0   Minutes 10   Recumbant Bike   Level 2   RPM 40   Watts 20   Minutes 10   NuStep   Level 2   Watts 40   Minutes 10   Arm Ergometer   Level 1   Watts 10   Minutes 10   REL-XR   Level 2   Watts 40   Minutes 10   Prescription Details   Frequency (times per week) 3   Duration Progress to 30 minutes of continuous aerobic without signs/symptoms of physical distress   Intensity   THRR REST +  30   Ratings of Perceived Exertion 11-15   Perceived Dyspnea 2-4   Progression Continue progressive overload as per policy without signs/symptoms or physical distress.   Resistance Training   Training Prescription Yes   Weight 2   Reps 10-15      Exercise Prescription Changes:   Discharge Exercise Prescription (Final Exercise Prescription Changes):    Nutrition:  Target Goals: Understanding of nutrition guidelines, daily intake of sodium 1500mg , cholesterol 200mg , calories 30% from fat and 7% or less from saturated fats, daily to have 5 or more  servings of fruits and vegetables.  Biometrics:     Pre Biometrics - 04/23/15 1653    Pre Biometrics   Height 5\' 7"  (1.702 m)   Waist Circumference 46 inches   Hip Circumference 49.5 inches   Waist to Hip Ratio 0.93 %       Nutrition Therapy Plan and Nutrition Goals:     Nutrition Therapy & Goals - 04/23/15 1115    Nutrition Therapy   Diet Ms Lunsford would like to meet with the dietitian; she cooks a healthy meal twice a week and has leftovers; she likes peanut butter sandwiches; very hard to cook for one person; she does dring 8gl of fluid a day      Nutrition Discharge: Rate Your Plate Scores:   Psychosocial: Target Goals: Acknowledge presence or absence of depression, maximize coping skills, provide positive support system. Participant is able to verbalize types and ability to use techniques and skills needed for reducing stress and depression.  Initial Review & Psychosocial Screening:     Initial Psych Review & Screening - 04/23/15 1115    Initial Review   Current issues with Current Depression   Family Dynamics   Good Support System? Yes   Concerns Recent loss of significant other   Comments Ms Coste is still suffering the loss of her husband of 19years - he died about 38yrs ago.  She does have good support from her 2 sons from her first marriage and enjoys her grand children.   Barriers   Psychosocial barriers to participate in program The patient should benefit from training in stress management and relaxation.   Screening Interventions   Interventions Program counselor consult;Encouraged to exercise      Quality of Life Scores:     Quality of Life - 04/23/15 1115    Quality of Life Scores   Health/Function Pre 9.03 %   Socioeconomic Pre 15.64 %   Psych/Spiritual Pre 9.25 %   Family Pre 25.13 %   GLOBAL Pre 12.53 %      PHQ-9:     Recent Review Flowsheet Data    Depression screen [facility] 2/9 04/23/2015   Decreased Interest 1   Down, Depressed, Hopeless 1    PHQ - 2 Score 2   Altered  sleeping 1   Tired, decreased energy 1   Change in appetite 1   Feeling bad or failure about yourself  1   Trouble concentrating 1   Moving slowly or fidgety/restless 0   Suicidal thoughts 0   PHQ-9 Score 7   Difficult doing work/chores Somewhat difficult      Psychosocial Evaluation and Intervention:   Psychosocial Re-Evaluation:  Education: Education Goals: Education classes will be provided on a weekly basis, covering required topics. Participant will state understanding/return demonstration of topics presented.  Learning Barriers/Preferences:     Learning Barriers/Preferences - 04/23/15 1115    Learning Barriers/Preferences   Learning Barriers None   Learning Preferences Group Instruction;Individual Instruction;Pictoral;Skilled Demonstration;Verbal Instruction;Video;Written Material      Education Topics: Initial Evaluation Education: - Verbal, written and demonstration of respiratory meds, RPE/PD scales, oximetry and breathing techniques. Instruction on use of nebulizers and MDIs: cleaning and proper use, rinsing mouth with steroid doses and importance of monitoring MDI activations.          Pulmonary Rehab from 04/23/2015 in [facility]   Date  04/23/15   Educator  LB   Instruction Review Code  2- meets goals/outcomes      General Nutrition Guidelines/Fats and Fiber: -Group instruction provided by verbal, written material, models and posters to present the general guidelines for heart healthy nutrition. Gives an explanation and review of dietary fats and fiber.   Controlling Sodium/Reading Food Labels: -Group verbal and written material supporting the discussion of sodium use in heart healthy nutrition. Review and explanation with models, verbal and written materials for utilization of the food label.   Exercise Physiology & Risk Factors: - Group verbal and written instruction with models to  review the exercise physiology of the cardiovascular system and associated critical values. Details cardiovascular disease risk factors and the goals associated with each risk factor.   Aerobic Exercise & Resistance Training: - Gives group verbal and written discussion on the health impact of inactivity. On the components of aerobic and resistive training programs and the benefits of this training and how to safely progress through these programs.   Flexibility, Balance, General Exercise Guidelines: - Provides group verbal and written instruction on the benefits of flexibility and balance training programs. Provides general exercise guidelines with specific guidelines to those with heart or lung disease. Demonstration and skill practice provided.   Stress Management: - Provides group verbal and written instruction about the health risks of elevated stress, cause of high stress, and healthy ways to reduce stress.   Depression: - Provides group verbal and written instruction on the correlation between heart/lung disease and depressed mood, treatment options, and the stigmas associated with seeking treatment.   Exercise & Equipment Safety: - Individual verbal instruction and demonstration of equipment use and safety with use of the equipment.   Infection Prevention: - Provides verbal and written material to individual with discussion of infection control including proper hand washing and proper equipment cleaning during exercise session.   Falls Prevention: - Provides verbal and written material to individual with discussion of falls prevention and safety.      Pulmonary Rehab from 04/23/2015 in [facility]   Date  04/23/15   Educator  LB   Instruction Review Code  2- meets goals/outcomes      Diabetes: - Individual verbal and written instruction to review signs/symptoms of diabetes, desired ranges of glucose level fasting, after meals and with  exercise. Advice that  pre and post exercise glucose checks will be done for 3 sessions at entry of program.   Chronic Lung Diseases: - Group verbal and written instruction to review new updates, new respiratory medications, new advancements in procedures and treatments. Provide informative websites and "800" numbers of self-education.   Lung Procedures: - Group verbal and written instruction to describe testing methods done to diagnose lung disease. Review the outcome of test results. Describe the treatment choices: Pulmonary Function Tests, ABGs and oximetry.   Energy Conservation: - Provide group verbal and written instruction for methods to conserve energy, plan and organize activities. Instruct on pacing techniques, use of adaptive equipment and posture/positioning to relieve shortness of breath.   Triggers: - Group verbal and written instruction to review types of environmental controls: home humidity, furnaces, filters, dust mite/pet prevention, HEPA vacuums. To discuss weather changes, air quality and the benefits of nasal washing.   Exacerbations: - Group verbal and written instruction to provide: warning signs, infection symptoms, calling MD promptly, preventive modes, and value of vaccinations. Review: effective airway clearance, coughing and/or vibration techniques. Create an Sport and exercise psychologist.   Oxygen: - Individual and group verbal and written instruction on oxygen therapy. Includes supplement oxygen, available portable oxygen systems, continuous and intermittent flow rates, oxygen safety, concentrators, and Medicare reimbursement for oxygen.      Pulmonary Rehab from 04/23/2015 in [facility]   Date  04/23/15   Educator  lb   Instruction Review Code  2- meets goals/outcomes      Respiratory Medications: - Group verbal and written instruction to review medications for lung disease. Drug class, frequency, complications, importance of spacers,  rinsing mouth after steroid MDI's, and proper cleaning methods for nebulizers.      Pulmonary Rehab from 04/23/2015 in [facility]   Date  04/23/15   Educator  LB   Instruction Review Code  2- meets goals/outcomes      AED/CPR: - Group verbal and written instruction with the use of models to demonstrate the basic use of the AED with the basic ABC's of resuscitation.   Breathing Retraining: - Provides individuals verbal and written instruction on purpose, frequency, and proper technique of diaphragmatic breathing and pursed-lipped breathing. Applies individual practice skills.      Pulmonary Rehab from 04/23/2015 in [facility]   Date  04/23/15   Educator  LB   Instruction Review Code  2- meets goals/outcomes      Anatomy and Physiology of the Lungs: - Group verbal and written instruction with the use of models to provide basic lung anatomy and physiology related to function, structure and complications of lung disease.   Heart Failure: - Group verbal and written instruction on the basics of heart failure: signs/symptoms, treatments, explanation of ejection fraction, enlarged heart and cardiomyopathy.   Sleep Apnea: - Individual verbal and written instruction to review Obstructive Sleep Apnea. Review of risk factors, methods for diagnosing and types of masks and machines for OSA.   Anxiety: - Provides group, verbal and written instruction on the correlation between heart/lung disease and anxiety, treatment options, and management of anxiety.   Relaxation: - Provides group, verbal and written instruction about the benefits of relaxation for patients with heart/lung disease. Also provides patients with examples of relaxation techniques.   Knowledge Questionnaire Score:     Knowledge Questionnaire Score - 04/23/15 1115    Knowledge Questionnaire Score   Pre Score -1  Personal Goals and Risk  Factors at Admission:     Personal Goals and Risk Factors at Admission - 04/23/15 1115    Personal Goals and Risk Factors on Admission    Weight Management Yes   Intervention Learn and follow the exercise and diet guidelines while in the program. Utilize the nutrition and education classes to help gain knowledge of the diet and exercise expectations in the program  Ms Massi would like to meet with the dietitian; she cooks a healthy meal twice a week and has leftovers; she likes peanut butter sandwiches; very hard to cook for one person; she does dring 8gl of fluid a day   Goal Weight 150 lb (68.04 kg)   Increase Aerobic Exercise and Physical Activity Yes   Intervention While in program, learn and follow the exercise prescription taught. Start at a low level workload and increase workload after able to maintain previous level for 30 minutes. Increase time before increasing intensity.  Ms Gallentine has never been so out of shape and is looking forward to a change in her lifestyle with exercise a part of it. Plan to check if she has [employer] engineer.   Understand more about Heart/Pulmonary Disease. Yes   Intervention While in program utilize professionals for any questions, and attend the education sessions. Great websites to use are www.americanheart.org or www.lung.org for reliable information.  Ms Schweigert is interested in education about COPD and daily management of her disease.   Improve shortness of breath with ADL's Yes   Intervention While in program, learn and follow the exercise prescription taught. Start at a low level workload and increase workload ad advised by the exercise physiologist. Increase time before increasing intensity.  Ms Hulett would like to improve her shortness of breath, especially with housework.   Develop more efficient breathing techniques such as purse lipped breathing and diaphragmatic breathing; and practicing self-pacing with activity Yes   Intervention While in program, learn  and utilize the specific breathing techniques taught to you. Continue to practice and use the techniques as needed.   Increase knowledge of respiratory medications and ability to use respiratory devices properly.  Yes   Intervention While in program learn and demonstrate appropriate use of your oxygen therapy by increasing flow with exertion, manage oxygen tank operation, including continuous and intermittent flow.  Understanding oxygen is a drug ordered by your physician.;While in program, learn to administer MDI, nebulizer, and spacer properly.;Learn to take respiratory medicine as ordered.;While in program, learn to Clean MDI, nebulizers, and spacers properly.  Ms Gillham uses [place], Spiriva, and Ventolin for inhalers. I gave her a spacer for her Ventolin MDI   with instructions. She is on 2l/m oxygen from [employer], and her portable system is a concentrator at intermittent flow.   Diabetes Yes   Goal Blood glucose control identified by blood glucose values, HgbA1C. Participant verbalizes understanding of the signs/symptoms of hyper/hypo glycemia, proper foot care and importance of medication and nutrition plan for blood glucose control.   Intervention Provide nutrition & aerobic exercise along with prescribed medications to achieve blood glucose in normal ranges: Fasting 65-99 mg/dL   Hypertension Yes   Goal Participant will see blood pressure controlled within the values of 140/89mm/Hg or within value directed by their physician.   Intervention Provide nutrition & aerobic exercise along with prescribed medications to achieve BP 140/90 or less.      Personal Goals and Risk Factors Review:    Personal Goals Discharge:    Comments:

## 2015-04-23 NOTE — Progress Notes (Deleted)
Daily Session Note  Patient Details  Name: [name] MRN: [id number] Date of Birth: Nov 11, 1945 Referring Provider:  [place] Pink, MD  Encounter Date: 04/23/2015  Check In:     Session Check In - 04/23/15 1115    Check-In   Staff Present Carson Myrtle BS, RRT, Respiratory Therapist;Gladstone Rosas BS, ACSM EP-C, Exercise Physiologist   ER physicians immediately available to respond to emergencies [facility] immediately available ER MD   Physician(s) Cinda Quest and Kinner   Pain Assessment   Currently in Pain? No/denies  occasionally knee pain         Goals Met:  Proper associated with RPD/PD & O2 Sat Exercise tolerated well No report of cardiac concerns or symptoms Strength training completed today  Goals Unmet:  Not Applicable  Goals Comments:   Dr. Emily Filbert is Medical Director for [facility] and [facility].

## 2015-04-23 NOTE — Patient Instructions (Signed)
Patient Instructions  Patient Details  Name: [name] MRN: [id number] Date of Birth: 06/09/1946 Referring Provider:  Jerl Mina, MD  Below are the personal goals you chose as well as exercise and nutrition goals. Our goal is to help you keep on track towards obtaining and maintaining your goals. We will be discussing your progress on these goals with you throughout the program.  Initial Exercise Prescription:     Initial Exercise Prescription - 04/23/15 1600    Date of Initial Exercise Prescription   Date 04/23/15   Treadmill   MPH 1   Grade 0   Minutes 10   Recumbant Bike   Level 2   RPM 40   Watts 20   Minutes 10   NuStep   Level 2   Watts 40   Minutes 10   Arm Ergometer   Level 1   Watts 10   Minutes 10   REL-XR   Level 2   Watts 40   Minutes 10   Prescription Details   Frequency (times per week) 3   Duration Progress to 30 minutes of continuous aerobic without signs/symptoms of physical distress   Intensity   THRR REST +  30   Ratings of Perceived Exertion 11-15   Perceived Dyspnea 2-4   Progression Continue progressive overload as per policy without signs/symptoms or physical distress.   Resistance Training   Training Prescription Yes   Weight 2   Reps 10-15      Exercise Goals: Frequency: Be able to perform aerobic exercise three times per week working toward 3-5 days per week.  Intensity: Work with a perceived exertion of 11 (fairly light) - 15 (hard) as tolerated. Follow your new exercise prescription and watch for changes in prescription as you progress with the program. Changes will be reviewed with you when they are made.  Duration: You should be able to do 30 minutes of continuous aerobic exercise in addition to a 5 minute warm-up and a 5 minute cool-down routine.  Nutrition Goals: Your personal nutrition goals will be established when you do your nutrition analysis with the dietician.  The following are nutrition guidelines to  follow: Cholesterol < /day Sodium < /day Fiber: Women over 50 yrs - 21 grams per day  Personal Goals:     Personal Goals and Risk Factors at Admission - 04/23/15 1115    Personal Goals and Risk Factors on Admission    Weight Management Yes   Intervention Learn and follow the exercise and diet guidelines while in the program. Utilize the nutrition and education classes to help gain knowledge of the diet and exercise expectations in the program  Ms Lill would like to meet with the dietitian; she cooks a healthy meal twice a week and has leftovers; she likes peanut butter sandwiches; very hard to cook for one person; she does dring 8gl of fluid a day   Goal Weight 150 lb (68.04 kg)   Increase Aerobic Exercise and Physical Activity Yes   Intervention While in program, learn and follow the exercise prescription taught. Start at a low level workload and increase workload after able to maintain previous level for 30 minutes. Increase time before increasing intensity.  Ms Amico has never been so out of shape and is looking forward to a change in her lifestyle with exercise a part of it. Plan to check if she has [employer] engineer.   Understand more about Heart/Pulmonary Disease. Yes   Intervention While in program utilize  professionals for any questions, and attend the education sessions. Great websites to use are www.americanheart.org or www.lung.org for reliable information.  Ms Malenfant is interested in education about COPD and daily management of her disease.   Improve shortness of breath with ADL's Yes   Intervention While in program, learn and follow the exercise prescription taught. Start at a low level workload and increase workload ad advised by the exercise physiologist. Increase time before increasing intensity.  Ms Andy would like to improve her shortness of breath, especially with housework.   Develop more efficient breathing techniques such as purse lipped breathing and diaphragmatic  breathing; and practicing self-pacing with activity Yes   Intervention While in program, learn and utilize the specific breathing techniques taught to you. Continue to practice and use the techniques as needed.   Increase knowledge of respiratory medications and ability to use respiratory devices properly.  Yes   Intervention While in program learn and demonstrate appropriate use of your oxygen therapy by increasing flow with exertion, manage oxygen tank operation, including continuous and intermittent flow.  Understanding oxygen is a drug ordered by your physician.;While in program, learn to administer MDI, nebulizer, and spacer properly.;Learn to take respiratory medicine as ordered.;While in program, learn to Clean MDI, nebulizers, and spacers properly.  Ms Bost uses [place], Spiriva, and Ventolin for inhalers. I gave her a spacer for her Ventolin MDI   with instructions. She is on 2l/m oxygen from [employer], and her portable system is a concentrator at intermittent flow.   Diabetes Yes   Goal Blood glucose control identified by blood glucose values, HgbA1C. Participant verbalizes understanding of the signs/symptoms of hyper/hypo glycemia, proper foot care and importance of medication and nutrition plan for blood glucose control.   Intervention Provide nutrition & aerobic exercise along with prescribed medications to achieve blood glucose in normal ranges: Fasting 65-99 mg/dL   Hypertension Yes   Goal Participant will see blood pressure controlled within the values of 140/10mm/Hg or within value directed by their physician.   Intervention Provide nutrition & aerobic exercise along with prescribed medications to achieve BP 140/90 or less.      Tobacco Use Initial Evaluation: History  Smoking status  . Former Smoker -- 2.00 packs/day for 47 years  . Types: Cigarettes  . Quit date: 08/11/2011  Smokeless tobacco  . Not on file    Copy of goals given to participant.

## 2015-04-24 ENCOUNTER — Encounter: Payer: Self-pay | Admitting: Respiratory Therapy

## 2015-04-24 DIAGNOSIS — J449 Chronic obstructive pulmonary disease, unspecified: Secondary | ICD-10-CM

## 2015-04-24 NOTE — Progress Notes (Signed)
Pulmonary Individual Treatment Plan  Patient Details  Name: [name] MRN: [id number] Date of Birth: Feb 28, 1946 Referring Provider:  Dr Jerl Mina  Initial Encounter Date: 04/23/2015  Visit Diagnosis: COPD, moderate  Patient's Home Medications on Admission:  Current outpatient prescriptions:    albuterol (PROAIR HFA) 108 (90 BASE) MCG/ACT inhaler, Inhale into the lungs., Disp: , Rfl:    aspirin (ASPIRIN LOW DOSE) 81 MG tablet, Take by mouth., Disp: , Rfl:    clopidogrel (PLAVIX) 75 MG tablet, TAKE 1 TABLET ONE TIME DAILY, Disp: , Rfl:    DULoxetine (CYMBALTA) 60 MG capsule, Take by mouth., Disp: , Rfl:    ferrous sulfate 324 (65 FE) MG TBEC, Take by mouth., Disp: , Rfl:    Fluticasone Furoate-Vilanterol (BREO ELLIPTA) 100-25 MCG/INH AEPB, Inhale into the lungs., Disp: , Rfl:    folic acid (FOLVITE) 800 MCG tablet, Take by mouth., Disp: , Rfl:    lisinopril (PRINIVIL,ZESTRIL) 5 MG tablet, TAKE 1 TABLET ONE TIME DAILY, Disp: , Rfl:    metoprolol (TOPROL-XL) 200 MG 24 hr tablet, Take by mouth., Disp: , Rfl:    niacin 500 MG tablet, Take by mouth., Disp: , Rfl:    omeprazole (PRILOSEC) 40 MG capsule, Take by mouth., Disp: , Rfl:    tiotropium (SPIRIVA) 18 MCG inhalation capsule, Place into inhaler and inhale., Disp: , Rfl:    torsemide (DEMADEX) 20 MG tablet, TAKE 1 TABLET ONE TIME DAILY, Disp: , Rfl:   Past Medical History: Past Medical History  Diagnosis Date   COPD (chronic obstructive pulmonary disease)    Hyperlipidemia    Hypertension    Diabetes    Peripheral vascular disease    Myocardial infarction     Tobacco Use: History  Smoking status   Former Smoker -- 2.00 packs/day for 47 years   Types: Cigarettes   Quit date: 08/11/2011  Smokeless tobacco   Not on file    Labs: Recent Review Flowsheet Data    Labs for ITP Cardiac and Pulmonary Rehab Latest Ref Rng 07/29/2012   Cholestrol 0-200 mg/dL 119   LDLCALC 1-478 mg/dL 57   HDL 29-56  mg/dL 21(H)   Trlycerides 0-865 mg/dL 784       ADL UCSD:     ADL UCSD      04/23/15 1115       ADL UCSD   ADL Phase Entry     SOB Score total 78     Rest 0     Walk 3     Stairs 5     Bath 3     Dress 3     Shop 4         Pulmonary Function Assessment:     Pulmonary Function Assessment - 04/23/15 1115    Pulmonary Function Tests   RV% 117 %   DLCO% 42.5 %   Initial Spirometry Results   FVC% 70 %   FEV1% 45 %   FEV1/FVC Ratio 50   Post Bronchodilator Spirometry Results   FVC% 66.8 %   FEV1% 43.3 %   FEV1/FVC Ratio 50   Breath   Bilateral Breath Sounds Decreased;Clear   Shortness of Breath Yes      Exercise Target Goals:    Exercise Program Goal: Individual exercise prescription set with THRR, safety & activity barriers. Participant demonstrates ability to understand and report RPE using BORG scale, to self-measure pulse accurately, and to acknowledge the importance of the exercise prescription.  Exercise Prescription Goal: Starting  with aerobic activity 30 plus minutes a day, 3 days per week for initial exercise prescription. Provide home exercise prescription and guidelines that participant acknowledges understanding prior to discharge.  Activity Barriers & Risk Stratification:     Activity Barriers & Risk Stratification - 04/23/15 1115    Activity Barriers & Risk Stratification   Activity Barriers Deconditioning;Muscular Weakness;Shortness of Breath;Assistive Device   Risk Stratification Moderate      6 Minute Walk:     6 Minute Walk      04/23/15 1648       6 Minute Walk   Phase Initial     Distance 460 feet     Walk Time 5.5 minutes     Resting HR 68 bpm     Resting BP 134/82 mmHg     Max Ex. HR 79 bpm     Max Ex. BP 138/70 mmHg     RPE 18     Perceived Dyspnea  8     Symptoms No        Initial Exercise Prescription:     Initial Exercise Prescription - 04/23/15 1600    Date of Initial Exercise Prescription   Date 04/23/15    Treadmill   MPH 1   Grade 0   Minutes 10   Recumbant Bike   Level 2   RPM 40   Watts 20   Minutes 10   NuStep   Level 2   Watts 40   Minutes 10   Arm Ergometer   Level 1   Watts 10   Minutes 10   REL-XR   Level 2   Watts 40   Minutes 10   Prescription Details   Frequency (times per week) 3   Duration Progress to 30 minutes of continuous aerobic without signs/symptoms of physical distress   Intensity   THRR REST +  30   Ratings of Perceived Exertion 11-15   Perceived Dyspnea 2-4   Progression Continue progressive overload as per policy without signs/symptoms or physical distress.   Resistance Training   Training Prescription Yes   Weight 2   Reps 10-15      Exercise Prescription Changes:   Discharge Exercise Prescription (Final Exercise Prescription Changes):    Nutrition:  Target Goals: Understanding of nutrition guidelines, daily intake of sodium 1500mg , cholesterol 200mg , calories 30% from fat and 7% or less from saturated fats, daily to have 5 or more servings of fruits and vegetables.  Biometrics:     Pre Biometrics - 04/23/15 1653    Pre Biometrics   Height 5\' 7"  (1.702 m)   Waist Circumference 46 inches   Hip Circumference 49.5 inches   Waist to Hip Ratio 0.93 %       Nutrition Therapy Plan and Nutrition Goals:     Nutrition Therapy & Goals - 04/23/15 1115    Nutrition Therapy   Diet Ms Cookston would like to meet with the dietitian; she cooks a healthy meal twice a week and has leftovers; she likes peanut butter sandwiches; very hard to cook for one person; she does dring 8gl of fluid a day      Nutrition Discharge: Rate Your Plate Scores:   Psychosocial: Target Goals: Acknowledge presence or absence of depression, maximize coping skills, provide positive support system. Participant is able to verbalize types and ability to use techniques and skills needed for reducing stress and depression.  Initial Review & Psychosocial Screening:      Initial Psych Review &  Screening - 04/23/15 1115    Initial Review   Current issues with Current Depression   Family Dynamics   Good Support System? Yes   Concerns Recent loss of significant other   Comments Ms Cochrane is still suffering the loss of her husband of 28years - he died about 33yrs ago.  She does have good support from her 2 sons from her first marriage and enjoys her grand children.   Barriers   Psychosocial barriers to participate in program The patient should benefit from training in stress management and relaxation.   Screening Interventions   Interventions Program counselor consult;Encouraged to exercise      Quality of Life Scores:     Quality of Life - 04/23/15 1115    Quality of Life Scores   Health/Function Pre 9.03 %   Socioeconomic Pre 15.64 %   Psych/Spiritual Pre 9.25 %   Family Pre 25.13 %   GLOBAL Pre 12.53 %      PHQ-9:     Recent Review Flowsheet Data    Depression screen [facility] 2/9 04/23/2015   Decreased Interest 1   Down, Depressed, Hopeless 1   PHQ - 2 Score 2   Altered sleeping 1   Tired, decreased energy 1   Change in appetite 1   Feeling bad or failure about yourself  1   Trouble concentrating 1   Moving slowly or fidgety/restless 0   Suicidal thoughts 0   PHQ-9 Score 7   Difficult doing work/chores Somewhat difficult      Psychosocial Evaluation and Intervention:   Psychosocial Re-Evaluation:  Education: Education Goals: Education classes will be provided on a weekly basis, covering required topics. Participant will state understanding/return demonstration of topics presented.  Learning Barriers/Preferences:     Learning Barriers/Preferences - 04/23/15 1115    Learning Barriers/Preferences   Learning Barriers None   Learning Preferences Group Instruction;Individual Instruction;Pictoral;Skilled Demonstration;Verbal Instruction;Video;Written Material      Education Topics: Initial Evaluation Education: - Verbal, written  and demonstration of respiratory meds, RPE/PD scales, oximetry and breathing techniques. Instruction on use of nebulizers and MDIs: cleaning and proper use, rinsing mouth with steroid doses and importance of monitoring MDI activations.          Pulmonary Rehab from 04/23/2015 in [facility]   Date  04/23/15   Educator  LB   Instruction Review Code  2- meets goals/outcomes      General Nutrition Guidelines/Fats and Fiber: -Group instruction provided by verbal, written material, models and posters to present the general guidelines for heart healthy nutrition. Gives an explanation and review of dietary fats and fiber.   Controlling Sodium/Reading Food Labels: -Group verbal and written material supporting the discussion of sodium use in heart healthy nutrition. Review and explanation with models, verbal and written materials for utilization of the food label.   Exercise Physiology & Risk Factors: - Group verbal and written instruction with models to review the exercise physiology of the cardiovascular system and associated critical values. Details cardiovascular disease risk factors and the goals associated with each risk factor.   Aerobic Exercise & Resistance Training: - Gives group verbal and written discussion on the health impact of inactivity. On the components of aerobic and resistive training programs and the benefits of this training and how to safely progress through these programs.   Flexibility, Balance, General Exercise Guidelines: - Provides group verbal and written instruction on the benefits of flexibility and balance training programs. Provides general exercise  guidelines with specific guidelines to those with heart or lung disease. Demonstration and skill practice provided.   Stress Management: - Provides group verbal and written instruction about the health risks of elevated stress, cause of high stress, and healthy ways to reduce  stress.   Depression: - Provides group verbal and written instruction on the correlation between heart/lung disease and depressed mood, treatment options, and the stigmas associated with seeking treatment.   Exercise & Equipment Safety: - Individual verbal instruction and demonstration of equipment use and safety with use of the equipment.   Infection Prevention: - Provides verbal and written material to individual with discussion of infection control including proper hand washing and proper equipment cleaning during exercise session.   Falls Prevention: - Provides verbal and written material to individual with discussion of falls prevention and safety.      Pulmonary Rehab from 04/23/2015 in [facility]   Date  04/23/15   Educator  LB   Instruction Review Code  2- meets goals/outcomes      Diabetes: - Individual verbal and written instruction to review signs/symptoms of diabetes, desired ranges of glucose level fasting, after meals and with exercise. Advice that pre and post exercise glucose checks will be done for 3 sessions at entry of program.   Chronic Lung Diseases: - Group verbal and written instruction to review new updates, new respiratory medications, new advancements in procedures and treatments. Provide informative websites and "800" numbers of self-education.   Lung Procedures: - Group verbal and written instruction to describe testing methods done to diagnose lung disease. Review the outcome of test results. Describe the treatment choices: Pulmonary Function Tests, ABGs and oximetry.   Energy Conservation: - Provide group verbal and written instruction for methods to conserve energy, plan and organize activities. Instruct on pacing techniques, use of adaptive equipment and posture/positioning to relieve shortness of breath.   Triggers: - Group verbal and written instruction to review types of environmental controls: home  humidity, furnaces, filters, dust mite/pet prevention, HEPA vacuums. To discuss weather changes, air quality and the benefits of nasal washing.   Exacerbations: - Group verbal and written instruction to provide: warning signs, infection symptoms, calling MD promptly, preventive modes, and value of vaccinations. Review: effective airway clearance, coughing and/or vibration techniques. Create an Sport and exercise psychologist.   Oxygen: - Individual and group verbal and written instruction on oxygen therapy. Includes supplement oxygen, available portable oxygen systems, continuous and intermittent flow rates, oxygen safety, concentrators, and Medicare reimbursement for oxygen.      Pulmonary Rehab from 04/23/2015 in [facility]   Date  04/23/15   Educator  lb   Instruction Review Code  2- meets goals/outcomes      Respiratory Medications: - Group verbal and written instruction to review medications for lung disease. Drug class, frequency, complications, importance of spacers, rinsing mouth after steroid MDI's, and proper cleaning methods for nebulizers.      Pulmonary Rehab from 04/23/2015 in [facility]   Date  04/23/15   Educator  LB   Instruction Review Code  2- meets goals/outcomes      AED/CPR: - Group verbal and written instruction with the use of models to demonstrate the basic use of the AED with the basic ABC's of resuscitation.   Breathing Retraining: - Provides individuals verbal and written instruction on purpose, frequency, and proper technique of diaphragmatic breathing and pursed-lipped breathing. Applies individual practice skills.  Pulmonary Rehab from 04/23/2015 in [facility]   Date  04/23/15   Educator  LB   Instruction Review Code  2- meets goals/outcomes      Anatomy and Physiology of the Lungs: - Group verbal and written instruction with the use of models to provide  basic lung anatomy and physiology related to function, structure and complications of lung disease.   Heart Failure: - Group verbal and written instruction on the basics of heart failure: signs/symptoms, treatments, explanation of ejection fraction, enlarged heart and cardiomyopathy.   Sleep Apnea: - Individual verbal and written instruction to review Obstructive Sleep Apnea. Review of risk factors, methods for diagnosing and types of masks and machines for OSA.   Anxiety: - Provides group, verbal and written instruction on the correlation between heart/lung disease and anxiety, treatment options, and management of anxiety.   Relaxation: - Provides group, verbal and written instruction about the benefits of relaxation for patients with heart/lung disease. Also provides patients with examples of relaxation techniques.   Knowledge Questionnaire Score:     Knowledge Questionnaire Score - 04/23/15 1115    Knowledge Questionnaire Score   Pre Score -1      Personal Goals and Risk Factors at Admission:     Personal Goals and Risk Factors at Admission - 04/23/15 1115    Personal Goals and Risk Factors on Admission    Weight Management Yes   Intervention Learn and follow the exercise and diet guidelines while in the program. Utilize the nutrition and education classes to help gain knowledge of the diet and exercise expectations in the program  Ms Andy would like to meet with the dietitian; she cooks a healthy meal twice a week and has leftovers; she likes peanut butter sandwiches; very hard to cook for one person; she does dring 8gl of fluid a day   Goal Weight 150 lb (68.04 kg)   Increase Aerobic Exercise and Physical Activity Yes   Intervention While in program, learn and follow the exercise prescription taught. Start at a low level workload and increase workload after able to maintain previous level for 30 minutes. Increase time before increasing intensity.  Ms Leaver has never been so  out of shape and is looking forward to a change in her lifestyle with exercise a part of it. Plan to check if she has [employer].   Understand more about Heart/Pulmonary Disease. Yes   Intervention While in program utilize professionals for any questions, and attend the education sessions. Great websites to use are www.americanheart.org or www.lung.org for reliable information.  Ms Varone is interested in education about COPD and daily management of her disease.   Improve shortness of breath with ADL's Yes   Intervention While in program, learn and follow the exercise prescription taught. Start at a low level workload and increase workload ad advised by the exercise physiologist. Increase time before increasing intensity.  Ms Macbride would like to improve her shortness of breath, especially with housework.   Develop more efficient breathing techniques such as purse lipped breathing and diaphragmatic breathing; and practicing self-pacing with activity Yes   Intervention While in program, learn and utilize the specific breathing techniques taught to you. Continue to practice and use the techniques as needed.   Increase knowledge of respiratory medications and ability to use respiratory devices properly.  Yes   Intervention While in program learn and demonstrate appropriate use of your oxygen therapy by increasing flow with exertion, manage oxygen tank operation,  including continuous and intermittent flow.  Understanding oxygen is a drug ordered by your physician.;While in program, learn to administer MDI, nebulizer, and spacer properly.;Learn to take respiratory medicine as ordered.;While in program, learn to Clean MDI, nebulizers, and spacers properly.  Ms Newbrough uses [place], Spiriva, and Ventolin for inhalers. I gave her a spacer for her Ventolin MDI   with instructions. She is on 2l/m oxygen from [employer], and her portable system is a concentrator at intermittent flow.   Diabetes Yes   Goal Blood glucose  control identified by blood glucose values, HgbA1C. Participant verbalizes understanding of the signs/symptoms of hyper/hypo glycemia, proper foot care and importance of medication and nutrition plan for blood glucose control.   Intervention Provide nutrition & aerobic exercise along with prescribed medications to achieve blood glucose in normal ranges: Fasting 65-99 mg/dL   Hypertension Yes   Goal Participant will see blood pressure controlled within the values of 140/98mm/Hg or within value directed by their physician.   Intervention Provide nutrition & aerobic exercise along with prescribed medications to achieve BP 140/90 or less.      Personal Goals and Risk Factors Review:    Personal Goals Discharge:    Comments: Ms Bultman plans to start [facility] on 04/26/2015 and attend 2-3 days/week.

## 2015-04-24 NOTE — Patient Instructions (Signed)
pul Patient Instructions  Patient Details  Name: [name] MRN: [id number] Date of Birth: Feb 05, 1946 Referring Provider:  Dr Jerl Mina  Below are the personal goals you chose as well as exercise and nutrition goals. Our goal is to help you keep on track towards obtaining and maintaining your goals. We will be discussing your progress on these goals with you throughout the program.  Initial Exercise Prescription:     Initial Exercise Prescription - 04/23/15 1600    Date of Initial Exercise Prescription   Date 04/23/15   Treadmill   MPH 1   Grade 0   Minutes 10   Recumbant Bike   Level 2   RPM 40   Watts 20   Minutes 10   NuStep   Level 2   Watts 40   Minutes 10   Arm Ergometer   Level 1   Watts 10   Minutes 10   REL-XR   Level 2   Watts 40   Minutes 10   Prescription Details   Frequency (times per week) 3   Duration Progress to 30 minutes of continuous aerobic without signs/symptoms of physical distress   Intensity   THRR REST +  30   Ratings of Perceived Exertion 11-15   Perceived Dyspnea 2-4   Progression Continue progressive overload as per policy without signs/symptoms or physical distress.   Resistance Training   Training Prescription Yes   Weight 2   Reps 10-15      Exercise Goals: Frequency: Be able to perform aerobic exercise three times per week working toward 3-5 days per week.  Intensity: Work with a perceived exertion of 11 (fairly light) - 15 (hard) as tolerated. Follow your new exercise prescription and watch for changes in prescription as you progress with the program. Changes will be reviewed with you when they are made.  Duration: You should be able to do 30 minutes of continuous aerobic exercise in addition to a 5 minute warm-up and a 5 minute cool-down routine.  Nutrition Goals: Your personal nutrition goals will be established when you do your nutrition analysis with the dietician.  The following are nutrition guidelines to  follow: Cholesterol < /day Sodium < /day Fiber: Women over 50 yrs - 21 grams per day  Personal Goals:     Personal Goals and Risk Factors at Admission - 04/23/15 1115    Personal Goals and Risk Factors on Admission    Weight Management Yes   Intervention Learn and follow the exercise and diet guidelines while in the program. Utilize the nutrition and education classes to help gain knowledge of the diet and exercise expectations in the program  Ms Alejo would like to meet with the dietitian; she cooks a healthy meal twice a week and has leftovers; she likes peanut butter sandwiches; very hard to cook for one person; she does dring 8gl of fluid a day   Goal Weight 150 lb (68.04 kg)   Increase Aerobic Exercise and Physical Activity Yes   Intervention While in program, learn and follow the exercise prescription taught. Start at a low level workload and increase workload after able to maintain previous level for 30 minutes. Increase time before increasing intensity.  Ms Blackston has never been so out of shape and is looking forward to a change in her lifestyle with exercise a part of it. Plan to check if she has [employer].   Understand more about Heart/Pulmonary Disease. Yes   Intervention While in program  utilize professionals for any questions, and attend the education sessions. Great websites to use are www.americanheart.org or www.lung.org for reliable information.  Ms Westerhold is interested in education about COPD and daily management of her disease.   Improve shortness of breath with ADL's Yes   Intervention While in program, learn and follow the exercise prescription taught. Start at a low level workload and increase workload ad advised by the exercise physiologist. Increase time before increasing intensity.  Ms Kram would like to improve her shortness of breath, especially with housework.   Develop more efficient breathing techniques such as purse lipped breathing and diaphragmatic  breathing; and practicing self-pacing with activity Yes   Intervention While in program, learn and utilize the specific breathing techniques taught to you. Continue to practice and use the techniques as needed.   Increase knowledge of respiratory medications and ability to use respiratory devices properly.  Yes   Intervention While in program learn and demonstrate appropriate use of your oxygen therapy by increasing flow with exertion, manage oxygen tank operation, including continuous and intermittent flow.  Understanding oxygen is a drug ordered by your physician.;While in program, learn to administer MDI, nebulizer, and spacer properly.;Learn to take respiratory medicine as ordered.;While in program, learn to Clean MDI, nebulizers, and spacers properly.  Ms Matura uses [place], Spiriva, and Ventolin for inhalers. I gave her a spacer for her Ventolin MDI   with instructions. She is on 2l/m oxygen from [employer], and her portable system is a concentrator at intermittent flow.   Diabetes Yes   Goal Blood glucose control identified by blood glucose values, HgbA1C. Participant verbalizes understanding of the signs/symptoms of hyper/hypo glycemia, proper foot care and importance of medication and nutrition plan for blood glucose control.   Intervention Provide nutrition & aerobic exercise along with prescribed medications to achieve blood glucose in normal ranges: Fasting 65-99 mg/dL   Hypertension Yes   Goal Participant will see blood pressure controlled within the values of 140/32mm/Hg or within value directed by their physician.   Intervention Provide nutrition & aerobic exercise along with prescribed medications to achieve BP 140/90 or less.      Tobacco Use Initial Evaluation: History  Smoking status   Former Smoker -- 2.00 packs/day for 47 years   Types: Cigarettes   Quit date: 08/11/2011  Smokeless tobacco   Not on file    Copy of goals given to participant.

## 2015-04-28 DIAGNOSIS — J449 Chronic obstructive pulmonary disease, unspecified: Secondary | ICD-10-CM | POA: Diagnosis not present

## 2015-05-01 ENCOUNTER — Encounter: Payer: Commercial Managed Care - HMO | Admitting: *Deleted

## 2015-05-01 DIAGNOSIS — J449 Chronic obstructive pulmonary disease, unspecified: Secondary | ICD-10-CM | POA: Diagnosis not present

## 2015-05-01 LAB — GLUCOSE, CAPILLARY
GLUCOSE-CAPILLARY: 134 mg/dL — AB (ref 65–99)
Glucose-Capillary: 73 mg/dL (ref 65–99)

## 2015-05-01 NOTE — Progress Notes (Signed)
At 4:00am had one piece of toast, peanut butter, one coffee and 16 oz of water

## 2015-05-01 NOTE — Progress Notes (Signed)
Daily Session Note  Patient Details  Name: [name] MRN: [id number] Date of Birth: Jun 24, 1946 Referring [name]:  Taylor Pink, MD  Encounter Date: 05/01/2015  Check In:     Session Check In - 05/01/15 1052    Check-In   Staff Present [name] BS, RRT, Respiratory Therapist;Taylor Dillard Essex MS, ACSM CEP Exercise Physiologist;[name] BS, ACSM EP-C, Exercise Physiologist   ER physicians immediately available to respond to emergencies [facility] immediately available ER MD   Physician(s) [name] and [name]   Medication changes reported     No   Fall or balance concerns reported    No   Warm-up and Cool-down Performed on first and last piece of equipment   VAD Patient? No   Pain Assessment   Currently in Pain? No/denies   Multiple Pain Sites No         POCT Glucose - 05/01/15 1226    POCT Blood Glucose   Pre-Exercise 73 mg/dL   Post-Exercise 134 mg/dL          Exercise Prescription Changes - 05/01/15 1200    Response to Exercise   Blood Pressure (Admit) 142/76 mmHg   Blood Pressure (Exit) 128/80 mmHg   Heart Rate (Admit) 72 bpm   Heart Rate (Exercise) 75 bpm   Heart Rate (Exit) 72 bpm   Oxygen Saturation (Admit) 98 %   Oxygen Saturation (Exercise) 95 %   Oxygen Saturation (Exit) 99 %   Rating of Perceived Exertion (Exercise) 11   Perceived Dyspnea (Exercise) 0   Symptoms No   Duration Progress to 30 minutes of continuous aerobic without signs/symptoms of physical distress   Intensity Rest + 30   Progression Continue progressive overload as per policy without signs/symptoms or physical distress.   Resistance Training   Training Prescription Yes   Weight 1   Reps 10-12   Interval Training   Interval Training No   Treadmill   MPH 1   Grade 0   Minutes 10   NuStep   Level 2   Watts 20   Minutes 10   Recumbant Elliptical   Level 1  BioStep   Watts 15   Minutes 10      Goals Met:  Proper associated with RPD/PD & O2 Sat Exercise  tolerated well Personal goals reviewed Queuing for purse lip breathing Strength training completed today  Goals Unmet:  Not Applicable  Goals Comments: First day of exercise. Oriented [name] to exercise equipment and reviewed gym policies and procedures. Talked with [name] about her exercise goals in the program and explained her individualized treatment plan.   [name]'s glucose was 73 before exercise and 4 glucose tabs were given. Her glucose was within acceptable range after exercise.    Dr. Emily Filbert is Medical Director for [facility] and [facility].

## 2015-05-06 ENCOUNTER — Encounter: Payer: Commercial Managed Care - HMO | Admitting: *Deleted

## 2015-05-06 DIAGNOSIS — J449 Chronic obstructive pulmonary disease, unspecified: Secondary | ICD-10-CM

## 2015-05-06 LAB — GLUCOSE, CAPILLARY
Glucose-Capillary: 167 mg/dL — ABNORMAL HIGH (ref 65–99)
Glucose-Capillary: 115 mg/dL — ABNORMAL HIGH (ref 65–99)

## 2015-05-06 NOTE — Progress Notes (Signed)
Daily Session Note  Patient Details  Name: EMYAH ROZNOWSKI MRN: [id number] Date of Birth: August 15, 1945 Referring Provider:  [place] Pink, MD  Encounter Date: 05/06/2015  Check In:     Session Check In - 05/06/15 1127    Check-In   Staff Present Laureen Owens Shark BS, RRT, Respiratory Therapist;Kelly Alfonso Patten, ACSM CEP Exercise Physiologist;Steven Way BS, ACSM EP-C, Exercise Physiologist   ER physicians immediately available to respond to emergencies [facility] immediately available ER MD   Physician(s) Jacqualine Code and Jimmye Norman   Medication changes reported     No   Fall or balance concerns reported    No   Warm-up and Cool-down Performed on first and last piece of equipment   VAD Patient? No   Pain Assessment   Currently in Pain? No/denies   Multiple Pain Sites No         Goals Met:  Proper associated with RPD/PD & O2 Sat Independence with exercise equipment Exercise tolerated well Strength training completed today  Goals Unmet:  Not Applicable  Goals Comments: Progressing with exercise goals - increasing time to 102mnutes; goal is 583ms.   Dr. MaEmily Filberts Medical Director for [facility].

## 2015-05-07 ENCOUNTER — Encounter: Payer: Self-pay | Admitting: Respiratory Therapy

## 2015-05-08 ENCOUNTER — Encounter: Payer: Commercial Managed Care - HMO | Admitting: *Deleted

## 2015-05-08 DIAGNOSIS — J449 Chronic obstructive pulmonary disease, unspecified: Secondary | ICD-10-CM | POA: Diagnosis not present

## 2015-05-08 LAB — GLUCOSE, CAPILLARY
GLUCOSE-CAPILLARY: 167 mg/dL — AB (ref 65–99)
Glucose-Capillary: 121 mg/dL — ABNORMAL HIGH (ref 65–99)

## 2015-05-08 NOTE — Progress Notes (Signed)
Daily Session Note  Patient Details  Name: [name] MRN: [id number] Date of Birth: Sep 14, 1945 Referring Provider:  [place] Pink, MD  Encounter Date: 05/08/2015  Check In:     Session Check In - 05/08/15 1105    Check-In   Staff Present Laureen Owens Shark BS, RRT, Respiratory Therapist;Renee Dillard Essex MS, ACSM CEP Exercise Physiologist;Other   ER physicians immediately available to respond to emergencies [facility] immediately available ER MD   Physician(s) Clearnce Hasten and Paduchowski   Medication changes reported     No   Fall or balance concerns reported    No   Warm-up and Cool-down Performed on first and last piece of equipment   VAD Patient? No   Pain Assessment   Currently in Pain? No/denies   Multiple Pain Sites No           Exercise Prescription Changes - 05/08/15 1100    Exercise Review   Progression Yes   Response to Exercise   Comments Is very compliant with all exercise increases and goals and appreciates the challenges   Duration Progress to 30 minutes of continuous aerobic without signs/symptoms of physical distress   Intensity Rest + 30   Progression Continue progressive overload as per policy without signs/symptoms or physical distress.   Resistance Training   Training Prescription Yes   Weight 1   Reps 10-12   Interval Training   Interval Training No   Treadmill   MPH 1   Grade 0   Minutes 10   NuStep   Level 2   Watts 20   Minutes 15   Recumbant Elliptical   Level 2  BioStep   Watts 20   Minutes 15      Goals Met:  Proper associated with RPD/PD & O2 Sat Independence with exercise equipment Using PLB without cueing & demonstrates good technique Exercise tolerated well Personal goals reviewed Strength training completed today  Goals Unmet:  Not Applicable  Goals Comments: Reviewed individualized exercise prescription and made increases per departmental policy. Exercise increases were discussed with the patient and they were able to  perform the new work loads without issue (no signs or symptoms).     Dr. Emily Filbert is Medical Director for [facility] and [facility].

## 2015-05-13 ENCOUNTER — Encounter: Payer: Commercial Managed Care - HMO | Attending: Family Medicine | Admitting: Respiratory Therapy

## 2015-05-13 DIAGNOSIS — J449 Chronic obstructive pulmonary disease, unspecified: Secondary | ICD-10-CM | POA: Insufficient documentation

## 2015-05-14 NOTE — Progress Notes (Signed)
Daily Session Note  Patient Details  Name: [name] MRN: [id number] Date of Birth: 08-22-1945 Referring Provider:  [place] Pink, MD  Encounter Date: 05/13/2015  Check In:        Exercise Prescription Changes - 05/13/15 1100    Response to Exercise   Blood Pressure (Admit) 110/66 mmHg   Blood Pressure (Exercise) 132/72 mmHg   Blood Pressure (Exit) 120/70 mmHg   Heart Rate (Admit) 65 bpm   Heart Rate (Exercise) 105 bpm   Heart Rate (Exit) 89 bpm   Oxygen Saturation (Admit) 95 %   Oxygen Saturation (Exercise) 97 %   Oxygen Saturation (Exit) 98 %   Rating of Perceived Exertion (Exercise) 13   Perceived Dyspnea (Exercise) 2   Symptoms No   Duration Progress to 30 minutes of continuous aerobic without signs/symptoms of physical distress   Intensity THRR unchanged   Resistance Training   Training Prescription Yes   Weight 1   Reps 10-12   Interval Training   Interval Training No   Treadmill   MPH 1   Grade 0   Minutes 10   NuStep   Level 2   Watts 40   Minutes 15   Recumbant Elliptical   Level 2  BioStep   Watts 20   Minutes 15      Goals Met:  [name] was absent from class today.  Goals Unmet:  Not Applicable  Goals Comments:    Dr. Emily Filbert is Medical Director for [place] and [facility].

## 2015-05-15 ENCOUNTER — Telehealth: Payer: Self-pay | Admitting: *Deleted

## 2015-05-15 ENCOUNTER — Encounter: Payer: Self-pay | Admitting: *Deleted

## 2015-05-15 DIAGNOSIS — J449 Chronic obstructive pulmonary disease, unspecified: Secondary | ICD-10-CM | POA: Diagnosis not present

## 2015-05-15 NOTE — Telephone Encounter (Signed)
[name] left a vm that she is sorry that she can't attend Pulmonary Rehab today since she has a virus and hopes to be back soon.

## 2015-05-20 ENCOUNTER — Encounter: Payer: Commercial Managed Care - HMO | Admitting: *Deleted

## 2015-05-20 DIAGNOSIS — J449 Chronic obstructive pulmonary disease, unspecified: Secondary | ICD-10-CM

## 2015-05-20 NOTE — Progress Notes (Signed)
Pulmonary Individual Treatment Plan  Patient Details  Name: [name] MRN: [id number] Date of Birth: May 17, 1946 Referring Provider:  [place] Pink, MD  Initial Encounter Date:  04/23/15  Visit Diagnosis: COPD, moderate ([place])  Patient's Home Medications on Admission:  Current outpatient prescriptions:  .  albuterol (PROAIR HFA) 108 (90 BASE) MCG/ACT inhaler, Inhale into the lungs., Disp: , Rfl:  .  aspirin (ASPIRIN LOW DOSE) 81 MG tablet, Take by mouth., Disp: , Rfl:  .  clopidogrel (PLAVIX) 75 MG tablet, TAKE 1 TABLET ONE TIME DAILY, Disp: , Rfl:  .  DULoxetine (CYMBALTA) 60 MG capsule, Take by mouth., Disp: , Rfl:  .  ferrous sulfate 324 (65 FE) MG TBEC, Take by mouth., Disp: , Rfl:  .  Fluticasone Furoate-Vilanterol (BREO ELLIPTA) 100-25 MCG/INH AEPB, Inhale into the lungs., Disp: , Rfl:  .  folic acid (FOLVITE) 323 MCG tablet, Take by mouth., Disp: , Rfl:  .  lisinopril (PRINIVIL,ZESTRIL) 5 MG tablet, TAKE 1 TABLET ONE TIME DAILY, Disp: , Rfl:  .  metoprolol (TOPROL-XL) 200 MG 24 hr tablet, Take by mouth., Disp: , Rfl:  .  niacin 500 MG tablet, Take by mouth., Disp: , Rfl:  .  omeprazole (PRILOSEC) 40 MG capsule, Take by mouth., Disp: , Rfl:  .  tiotropium (SPIRIVA) 18 MCG inhalation capsule, Place into inhaler and inhale., Disp: , Rfl:  .  torsemide (DEMADEX) 20 MG tablet, TAKE 1 TABLET ONE TIME DAILY, Disp: , Rfl:   Past Medical History: Past Medical History  Diagnosis Date  . COPD (chronic obstructive pulmonary disease)   . Hyperlipidemia   . Hypertension   . Diabetes   . Peripheral vascular disease   . Myocardial infarction     Tobacco Use: History  Smoking status  . Former Smoker -- 2.00 packs/day for 47 years  . Types: Cigarettes  . Quit date: 08/11/2011  Smokeless tobacco  . Not on file    Labs: Recent Review Flowsheet Data    Labs for ITP Cardiac and Pulmonary Rehab Latest Ref Rng 07/29/2012   Cholestrol 0-200 mg/dL 109   LDLCALC 0-100 mg/dL 57    HDL 40-60 mg/dL 31(L)   Trlycerides 0-200 mg/dL 105         POCT Glucose      05/01/15 1226 05/06/15 1000         POCT Blood Glucose   Pre-Exercise 73 mg/dL       Post-Exercise 134 mg/dL       Pre-Exercise #2  167 mg/dL      Post-Exercise #2  113 mg/dL         ADL UCSD:     ADL UCSD      04/23/15 1115       ADL UCSD   ADL Phase Entry     SOB Score total 78     Rest 0     Walk 3     Stairs 5     Bath 3     Dress 3     Shop 4         Pulmonary Function Assessment:     Pulmonary Function Assessment - 04/23/15 1115    Pulmonary Function Tests   RV% 117 %   DLCO% 42.5 %   Initial Spirometry Results   FVC% 70 %   FEV1% 45 %   FEV1/FVC Ratio 50   Post Bronchodilator Spirometry Results   FVC% 66.8 %   FEV1% 43.3 %   FEV1/FVC Ratio  50   Breath   Bilateral Breath Sounds Decreased;Clear   Shortness of Breath Yes      Exercise Target Goals:    Exercise Program Goal: Individual exercise prescription set with THRR, safety & activity barriers. Participant demonstrates ability to understand and report RPE using BORG scale, to self-measure pulse accurately, and to acknowledge the importance of the exercise prescription.  Exercise Prescription Goal: Starting with aerobic activity 30 plus minutes a day, 3 days per week for initial exercise prescription. Provide home exercise prescription and guidelines that participant acknowledges understanding prior to discharge.  Activity Barriers & Risk Stratification:     Activity Barriers & Risk Stratification - 04/23/15 1115    Activity Barriers & Risk Stratification   Activity Barriers Deconditioning;Muscular Weakness;Shortness of Breath;Assistive Device   Risk Stratification Moderate      6 Minute Walk:     6 Minute Walk      04/23/15 1648       6 Minute Walk   Phase Initial     Distance 460 feet     Walk Time 5.5 minutes     Resting HR 68 bpm     Resting BP 134/82 mmHg     Max Ex. HR 79 bpm     Max  Ex. BP 138/70 mmHg     RPE 18     Perceived Dyspnea  8     Symptoms No        Initial Exercise Prescription:     Initial Exercise Prescription - 04/23/15 1600    Date of Initial Exercise Prescription   Date 04/23/15   Treadmill   MPH 1   Grade 0   Minutes 10   Recumbant Bike   Level 2   RPM 40   Watts 20   Minutes 10   NuStep   Level 2   Watts 40   Minutes 10   Arm Ergometer   Level 1   Watts 10   Minutes 10   REL-XR   Level 2   Watts 40   Minutes 10   Prescription Details   Frequency (times per week) 3   Duration Progress to 30 minutes of continuous aerobic without signs/symptoms of physical distress   Intensity   THRR REST +  30   Ratings of Perceived Exertion 11-15   Perceived Dyspnea 2-4   Progression Continue progressive overload as per policy without signs/symptoms or physical distress.   Resistance Training   Training Prescription Yes   Weight 2   Reps 10-15      Exercise Prescription Changes:     Exercise Prescription Changes      05/01/15 1200 05/08/15 1100 05/13/15 1100 05/20/15 1100     Exercise Review   Progression  Yes  Yes    Response to Exercise   Blood Pressure (Admit) 142/76 mmHg  110/66 mmHg 110/66 mmHg    Blood Pressure (Exercise)   132/72 mmHg 132/72 mmHg    Blood Pressure (Exit) 128/80 mmHg  120/70 mmHg 120/70 mmHg    Heart Rate (Admit) 72 bpm  65 bpm 65 bpm    Heart Rate (Exercise) 75 bpm  105 bpm 105 bpm    Heart Rate (Exit) 72 bpm  89 bpm 89 bpm    Oxygen Saturation (Admit) 98 %  95 % 95 %    Oxygen Saturation (Exercise) 95 %  97 % 97 %    Oxygen Saturation (Exit) 99 %  98 % 98 %  Rating of Perceived Exertion (Exercise) '11  13 13    ' Perceived Dyspnea (Exercise) 0  2 2    Symptoms No  No No    Comments  Is very compliant with all exercise increases and goals and appreciates the challenges  The treadmill was bothering the patient's knees so she used the AE instead today.     Duration Progress to 30 minutes of continuous  aerobic without signs/symptoms of physical distress Progress to 30 minutes of continuous aerobic without signs/symptoms of physical distress Progress to 30 minutes of continuous aerobic without signs/symptoms of physical distress Progress to 30 minutes of continuous aerobic without signs/symptoms of physical distress    Intensity Rest + 30 Rest + 30 THRR unchanged THRR unchanged    Progression Continue progressive overload as per policy without signs/symptoms or physical distress. Continue progressive overload as per policy without signs/symptoms or physical distress.      Resistance Training   Training Prescription Yes Yes Yes Yes    Weight '1 1 1 1    ' Reps 10-12 10-12 10-12 10-12    Interval Training   Interval Training No No No No    Treadmill   MPH '1 1 1 1    ' Grade 0 0 0 0    Minutes '10 10 10 10    ' NuStep   Level '2 2 2 2    ' Watts 20 20 40 40    Minutes '10 15 15 15    ' Arm Ergometer   Level    1    Watts    10    Minutes    10    Recumbant Elliptical   Level 1  BioStep 2  BioStep 2  BioStep 2  BioStep    Watts '15 20 20 20    ' Minutes '10 15 15 15       ' Discharge Exercise Prescription (Final Exercise Prescription Changes):     Exercise Prescription Changes - 05/20/15 1100    Exercise Review   Progression Yes   Response to Exercise   Blood Pressure (Admit) 110/66 mmHg   Blood Pressure (Exercise) 132/72 mmHg   Blood Pressure (Exit) 120/70 mmHg   Heart Rate (Admit) 65 bpm   Heart Rate (Exercise) 105 bpm   Heart Rate (Exit) 89 bpm   Oxygen Saturation (Admit) 95 %   Oxygen Saturation (Exercise) 97 %   Oxygen Saturation (Exit) 98 %   Rating of Perceived Exertion (Exercise) 13   Perceived Dyspnea (Exercise) 2   Symptoms No   Comments The treadmill was bothering the patient's knees so she used the AE instead today.    Duration Progress to 30 minutes of continuous aerobic without signs/symptoms of physical distress   Intensity THRR unchanged   Resistance Training    Training Prescription Yes   Weight 1   Reps 10-12   Interval Training   Interval Training No   Treadmill   MPH 1   Grade 0   Minutes 10   NuStep   Level 2   Watts 40   Minutes 15   Arm Ergometer   Level 1   Watts 10   Minutes 10   Recumbant Elliptical   Level 2  BioStep   Watts 20   Minutes 15       Nutrition:  Target Goals: Understanding of nutrition guidelines, daily intake of sodium <1587m, cholesterol <2088m calories 30% from fat and 7% or less from saturated fats, daily to have 5 or  more servings of fruits and vegetables.  Biometrics:     Pre Biometrics - 04/23/15 1653    Pre Biometrics   Height '5\' 7"'  (1.702 m)   Waist Circumference 46 inches   Hip Circumference 49.5 inches   Waist to Hip Ratio 0.93 %       Nutrition Therapy Plan and Nutrition Goals:     Nutrition Therapy & Goals - 04/23/15 1115    Nutrition Therapy   Diet Ms Pfahler would like to meet with the dietitian; she cooks a healthy meal twice a week and has leftovers; she likes peanut butter sandwiches; very hard to cook for one person; she does dring 8gl of fluid a day      Nutrition Discharge: Rate Your Plate Scores:   Psychosocial: Target Goals: Acknowledge presence or absence of depression, maximize coping skills, provide positive support system. Participant is able to verbalize types and ability to use techniques and skills needed for reducing stress and depression.  Initial Review & Psychosocial Screening:     Initial Psych Review & Screening - 04/23/15 1115    Initial Review   Current issues with Current Depression   Family Dynamics   Good Support System? Yes   Concerns Recent loss of significant other   Comments Ms Romanek is still suffering the loss of her husband of 44years - he died about 61yr ago.  She does have good support from her 2 sons from her first marriage and enjoys her grand children.   Barriers   Psychosocial barriers to participate in program The patient should  benefit from training in stress management and relaxation.   Screening Interventions   Interventions Program counselor consult;Encouraged to exercise      Quality of Life Scores:     Quality of Life - 04/23/15 1115    Quality of Life Scores   Health/Function Pre 9.03 %   Socioeconomic Pre 15.64 %   Psych/Spiritual Pre 9.25 %   Family Pre 25.13 %   GLOBAL Pre 12.53 %      PHQ-9:     Recent Review Flowsheet Data    [facility]   Decreased Interest 1   Down, Depressed, Hopeless 1   PHQ - 2 Score 2   Altered sleeping 1   Tired, decreased energy 1   Change in appetite 1   Feeling bad or failure about yourself  1   Trouble concentrating 1   Moving slowly or fidgety/restless 0   Suicidal thoughts 0   PHQ-9 Score 7   Difficult doing work/chores Somewhat difficult      Psychosocial Evaluation and Intervention:     Psychosocial Evaluation - 05/01/15 1051    Psychosocial Evaluation & Interventions   Interventions Stress management education;Relaxation education;Encouraged to exercise with the program and follow exercise prescription   Comments Counselor met with Ms. KSottiletoday for initial psychosocial evaluation.  She is a 69year old with COPD who has a strong support system of two adult sons and a sister who live close by.  Ms. KRaliegh Ipis also part of a local church community.  She reports not sleeping well and minimal appetite.  She has tried several OTC sleep aids without success.  Ms. KRaliegh Ipreports losing her spouse to lung cancer 5 years ago and has not slept well since then with maximum of 3-4 hours intermittent sleep per night.  Ms. KRaliegh Ipdenies a history of depression or anxiety, but does exhibit some symptoms of depression currently.  Her mood was a "5" on a scale of 1-10, and she stated if she "lost 25 pounds and got some sleep" she would feel better.  Ms. Raliegh Ip has goals to lose weight, breathe better and be able to line dance with her sister and her son as her  goals for this program.  Counselor encouraged her to participate in the depression education today, as well as speak to her Dr. or pharmacist about a natural OTC sleep aid that is sustained release.  Counselor also provided a local therapist contact information for Ms. Feimster and will follow up re: these recommendations.     Continued Psychosocial Services Needed Yes  Follow up on sleep, and recommendations for talk therapy to help with unresolved grief and loss issues.  She will benefit from meeting with the dietician for weight loss goals as well as participating in all of the psychoeducational components here.       Psychosocial Re-Evaluation:  Education: Education Goals: Education classes will be provided on a weekly basis, covering required topics. Participant will state understanding/return demonstration of topics presented.  Learning Barriers/Preferences:     Learning Barriers/Preferences - 04/23/15 1115    Learning Barriers/Preferences   Learning Barriers None   Learning Preferences Group Instruction;Individual Instruction;Pictoral;Skilled Demonstration;Verbal Instruction;Video;Written Material      Education Topics: Initial Evaluation Education: - Verbal, written and demonstration of respiratory meds, RPE/PD scales, oximetry and breathing techniques. Instruction on use of nebulizers and MDIs: cleaning and proper use, rinsing mouth with steroid doses and importance of monitoring MDI activations.          Pulmonary Rehab from 05/20/2015 in [place]   Date  04/23/15   Educator  LB   Instruction Review Code  2- meets goals/outcomes      General Nutrition Guidelines/Fats and Fiber: -Group instruction provided by verbal, written material, models and posters to present the general guidelines for heart healthy nutrition. Gives an explanation and review of dietary fats and fiber.      Pulmonary Rehab from 05/20/2015 in [place]   Date  05/20/15   Educator  PI   Instruction Review Code  2- meets goals/outcomes      Controlling Sodium/Reading Food Labels: -Group verbal and written material supporting the discussion of sodium use in heart healthy nutrition. Review and explanation with models, verbal and written materials for utilization of the food label.   Exercise Physiology & Risk Factors: - Group verbal and written instruction with models to review the exercise physiology of the cardiovascular system and associated critical values. Details cardiovascular disease risk factors and the goals associated with each risk factor.   Aerobic Exercise & Resistance Training: - Gives group verbal and written discussion on the health impact of inactivity. On the components of aerobic and resistive training programs and the benefits of this training and how to safely progress through these programs.   Flexibility, Balance, General Exercise Guidelines: - Provides group verbal and written instruction on the benefits of flexibility and balance training programs. Provides general exercise guidelines with specific guidelines to those with heart or lung disease. Demonstration and skill practice provided.   Stress Management: - Provides group verbal and written instruction about the health risks of elevated stress, cause of high stress, and healthy ways to reduce stress.   Depression: - Provides group verbal and written instruction on the correlation between heart/lung disease and depressed mood, treatment options, and the stigmas associated with seeking treatment.  Pulmonary Rehab from 05/20/2015 in [place]   Date  05/01/15   Educator  [facility]   Instruction Review Code  2- meets goals/outcomes      Exercise & Equipment Safety: - Individual verbal instruction and demonstration of equipment use and safety with use of the equipment.      Pulmonary Rehab from  05/20/2015 in [place]   Date  05/01/15   Educator  LB   Instruction Review Code  2- meets goals/outcomes      Infection Prevention: - Provides verbal and written material to individual with discussion of infection control including proper hand washing and proper equipment cleaning during exercise session.      Pulmonary Rehab from 05/20/2015 in [place]   Date  05/01/15   Educator  LB   Instruction Review Code  2- meets goals/outcomes      Falls Prevention: - Provides verbal and written material to individual with discussion of falls prevention and safety.      Pulmonary Rehab from 05/20/2015 in [place]   Date  04/23/15   Educator  LB   Instruction Review Code  2- meets goals/outcomes      Diabetes: - Individual verbal and written instruction to review signs/symptoms of diabetes, desired ranges of glucose level fasting, after meals and with exercise. Advice that pre and post exercise glucose checks will be done for 3 sessions at entry of program.      Pulmonary Rehab from 05/20/2015 in [place]   Date  05/01/15   Educator  LB   Instruction Review Code  2- meets goals/outcomes      Chronic Lung Diseases: - Group verbal and written instruction to review new updates, new respiratory medications, new advancements in procedures and treatments. Provide informative websites and "800" numbers of self-education.   Lung Procedures: - Group verbal and written instruction to describe testing methods done to diagnose lung disease. Review the outcome of test results. Describe the treatment choices: Pulmonary Function Tests, ABGs and oximetry.   Energy Conservation: - Provide group verbal and written instruction for methods to conserve energy, plan and organize activities. Instruct on pacing techniques, use of adaptive equipment  and posture/positioning to relieve shortness of breath.   Triggers: - Group verbal and written instruction to review types of environmental controls: home humidity, furnaces, filters, dust mite/pet prevention, HEPA vacuums. To discuss weather changes, air quality and the benefits of nasal washing.   Exacerbations: - Group verbal and written instruction to provide: warning signs, infection symptoms, calling MD promptly, preventive modes, and value of vaccinations. Review: effective airway clearance, coughing and/or vibration techniques. Create an Sports administrator.   Oxygen: - Individual and group verbal and written instruction on oxygen therapy. Includes supplement oxygen, available portable oxygen systems, continuous and intermittent flow rates, oxygen safety, concentrators, and Medicare reimbursement for oxygen.      Pulmonary Rehab from 05/20/2015 in [place]   Date  04/23/15   Educator  lb   Instruction Review Code  2- meets goals/outcomes      Respiratory Medications: - Group verbal and written instruction to review medications for lung disease. Drug class, frequency, complications, importance of spacers, rinsing mouth after steroid MDI's, and proper cleaning methods for nebulizers.      Pulmonary Rehab from 05/20/2015 in [place]   Date  04/23/15  Educator  LB   Instruction Review Code  2- meets goals/outcomes      AED/CPR: - Group verbal and written instruction with the use of models to demonstrate the basic use of the AED with the basic ABC's of resuscitation.   Breathing Retraining: - Provides individuals verbal and written instruction on purpose, frequency, and proper technique of diaphragmatic breathing and pursed-lipped breathing. Applies individual practice skills.      Pulmonary Rehab from 05/20/2015 in [place]   Date  04/23/15   Educator  LB    Instruction Review Code  2- meets goals/outcomes      Anatomy and Physiology of the Lungs: - Group verbal and written instruction with the use of models to provide basic lung anatomy and physiology related to function, structure and complications of lung disease.   Heart Failure: - Group verbal and written instruction on the basics of heart failure: signs/symptoms, treatments, explanation of ejection fraction, enlarged heart and cardiomyopathy.   Sleep Apnea: - Individual verbal and written instruction to review Obstructive Sleep Apnea. Review of risk factors, methods for diagnosing and types of masks and machines for OSA.   Anxiety: - Provides group, verbal and written instruction on the correlation between heart/lung disease and anxiety, treatment options, and management of anxiety.   Relaxation: - Provides group, verbal and written instruction about the benefits of relaxation for patients with heart/lung disease. Also provides patients with examples of relaxation techniques.   Knowledge Questionnaire Score:     Knowledge Questionnaire Score - 04/23/15 1115    Knowledge Questionnaire Score   Pre Score -1      Personal Goals and Risk Factors at Admission:     Personal Goals and Risk Factors at Admission - 04/23/15 1115    Personal Goals and Risk Factors on Admission    Weight Management Yes   Intervention Learn and follow the exercise and diet guidelines while in the program. Utilize the nutrition and education classes to help gain knowledge of the diet and exercise expectations in the program  Ms Fink would like to meet with the dietitian; she cooks a healthy meal twice a week and has leftovers; she likes peanut butter sandwiches; very hard to cook for one person; she does dring 8gl of fluid a day   Goal Weight 150 lb (68.04 kg)   Increase Aerobic Exercise and Physical Activity Yes   Intervention While in program, learn and follow the exercise prescription taught. Start  at a low level workload and increase workload after able to maintain previous level for 30 minutes. Increase time before increasing intensity.  Ms Sturdivant has never been so out of shape and is looking forward to a change in her lifestyle with exercise a part of it. Plan to check if she has [employer] research officer, government.   Understand more about Heart/Pulmonary Disease. Yes   Intervention While in program utilize professionals for any questions, and attend the education sessions. Great websites to use are www.americanheart.org or www.lung.org for reliable information.  Ms Bailey is interested in education about COPD and daily management of her disease.   Improve shortness of breath with ADL's Yes   Intervention While in program, learn and follow the exercise prescription taught. Start at a low level workload and increase workload ad advised by the exercise physiologist. Increase time before increasing intensity.  Ms West would like to improve her shortness of breath, especially with housework.   Develop more efficient breathing techniques such as purse lipped  breathing and diaphragmatic breathing; and practicing self-pacing with activity Yes   Intervention While in program, learn and utilize the specific breathing techniques taught to you. Continue to practice and use the techniques as needed.   Increase knowledge of respiratory medications and ability to use respiratory devices properly.  Yes   Intervention While in program learn and demonstrate appropriate use of your oxygen therapy by increasing flow with exertion, manage oxygen tank operation, including continuous and intermittent flow.  Understanding oxygen is a drug ordered by your physician.;While in program, learn to administer MDI, nebulizer, and spacer properly.;Learn to take respiratory medicine as ordered.;While in program, learn to Clean MDI, nebulizers, and spacers properly.  Ms Nickelson uses [place], Spiriva, and Ventolin for inhalers. I gave her a spacer for her  Ventolin MDI   with instructions. She is on 2l/m oxygen from [employer], and her portable system is a concentrator at intermittent flow.   Diabetes Yes   Goal Blood glucose control identified by blood glucose values, HgbA1C. Participant verbalizes understanding of the signs/symptoms of hyper/hypo glycemia, proper foot care and importance of medication and nutrition plan for blood glucose control.   Intervention Provide nutrition & aerobic exercise along with prescribed medications to achieve blood glucose in normal ranges: Fasting 65-99 mg/dL   Hypertension Yes   Goal Participant will see blood pressure controlled within the values of 140/32m/Hg or within value directed by their physician.   Intervention Provide nutrition & aerobic exercise along with prescribed medications to achieve BP 140/90 or less.      Personal Goals and Risk Factors Review:      Goals and Risk Factor Review      05/01/15 1000 05/06/15 1000 05/08/15 1107 05/14/15 0639     Increase Aerobic Exercise and Physical Activity   Goals Progress/Improvement seen   Yes Yes     Comments  Ms KKuwaharais working on increasing her exercise goals in [facility] and has increased her exercise time from 421mutes to 5067m. [facility]. Reviewed individualized exercise prescription and made increases per departmental policy. Exercise increases were discussed with the patient and they were able to perform the new work loads without issue (no signs or symptoms).      Improve shortness of breath with ADL's   Goals Progress/Improvement seen   Yes      Comments  Ms KinGuallpas good PD scores with her exercise and will encourage her to continue increasing her intensiy with her exercise goals.      Diabetes   Goal --  Ms KinCadles a low glucose before [facility] class. She ate at  4:00am, so I educated her on snacking before class and  a good snack with protein. --  Second reading pre and post  exercise: 167 and post 113;  --  Ms Glauber's glucose level was checked 3 sessions before exercise and after exercise. This record she can show Dr HedKary Kosd encouraged her to have an A!C drawn.    Hypertension   Goal  --  Maintaining acceptable BP at rest and Exercise in [facility]  --  Acceptable BP with exercise and at rest in Lung       Personal Goals Discharge (Final Personal Goals and Risk Factors Review):      Goals and Risk Factor Review - 05/14/15 0639    Diabetes   Goal --  Ms Harrel's glucose level was checked 3 sessions before exercise  and after exercise. This record she can show Dr Kary Kos and encouraged her to have an A!C drawn.   Hypertension   Goal --  Acceptable BP with exercise and at rest in Lung      Comments: 30 Day Review

## 2015-05-20 NOTE — Progress Notes (Signed)
Daily Session Note  Patient Details  Name: [name] MRN: [id number] Date of Birth: [birth year] Referring Provider:  [place] Pink, MD  Encounter Date: 05/20/2015  Check In:     Session Check In - 05/20/15 1104    Check-In   Staff Present Lestine Box BS, ACSM EP-C, Exercise Physiologist;Herron Fero Alfonso Patten, ACSM CEP Exercise Physiologist;Laureen Janell Quiet, RRT, Respiratory Therapist   ER physicians immediately available to respond to emergencies [facility] immediately available ER MD   Physician(s) Dr. Clearnce Hasten and Dr. Corky Downs   Medication changes reported     No   Fall or balance concerns reported    No   Warm-up and Cool-down Performed on first and last piece of equipment   VAD Patient? No   Pain Assessment   Currently in Pain? No/denies   Multiple Pain Sites No           Exercise Prescription Changes - 05/20/15 1100    Exercise Review   Progression Yes   Response to Exercise   Blood Pressure (Admit) 110/66 mmHg   Blood Pressure (Exercise) 132/72 mmHg   Blood Pressure (Exit) 120/70 mmHg   Heart Rate (Admit) 65 bpm   Heart Rate (Exercise) 105 bpm   Heart Rate (Exit) 89 bpm   Oxygen Saturation (Admit) 95 %   Oxygen Saturation (Exercise) 97 %   Oxygen Saturation (Exit) 98 %   Rating of Perceived Exertion (Exercise) 13   Perceived Dyspnea (Exercise) 2   Symptoms No   Comments The treadmill was bothering the patient's knees so she used the AE instead today.    Duration Progress to 30 minutes of continuous aerobic without signs/symptoms of physical distress   Intensity THRR unchanged   Resistance Training   Training Prescription Yes   Weight 1   Reps 10-12   Interval Training   Interval Training No   Treadmill   MPH 1   Grade 0   Minutes 10   NuStep   Level 2   Watts 40   Minutes 15   Arm Ergometer   Level 1   Watts 10   Minutes 10   Recumbant Elliptical   Level 2  BioStep   Watts 20   Minutes 15      Goals Met:  Proper associated with RPD/PD & O2  Sat [place] with exercise equipment Exercise tolerated well Strength training completed today  Goals Unmet:  Not Applicable  Goals Comments:    Dr. Emily Filbert is Medical Director for [facility] and [facility].

## 2015-05-22 DIAGNOSIS — R739 Hyperglycemia, unspecified: Secondary | ICD-10-CM | POA: Diagnosis not present

## 2015-05-28 DIAGNOSIS — J449 Chronic obstructive pulmonary disease, unspecified: Secondary | ICD-10-CM | POA: Diagnosis not present

## 2015-06-04 ENCOUNTER — Telehealth: Payer: Self-pay | Admitting: Respiratory Therapy

## 2015-06-04 ENCOUNTER — Encounter: Payer: Self-pay | Admitting: *Deleted

## 2015-06-04 ENCOUNTER — Encounter: Payer: Commercial Managed Care - HMO | Admitting: *Deleted

## 2015-06-04 VITALS — BP 124/82 | Ht 64.0 in | Wt 221.5 lb

## 2015-06-04 DIAGNOSIS — J449 Chronic obstructive pulmonary disease, unspecified: Secondary | ICD-10-CM | POA: Diagnosis not present

## 2015-06-04 DIAGNOSIS — E119 Type 2 diabetes mellitus without complications: Secondary | ICD-10-CM

## 2015-06-04 NOTE — Progress Notes (Signed)
Diabetes Self-Management Education  Visit Type: First/Initial  Appt. Start Time: 1330 Appt. End Time: 1455  06/05/2015  [name], identified by name and date of birth, is a 69 y.o. female with a diagnosis of Diabetes: Type 2.   ASSESSMENT  Blood pressure 124/82, height 5\' 4"  (1.626 m), weight 221 lb 8 oz (100.472 kg). Body mass index is 38 kg/(m^2).      Diabetes Self-Management Education - 06/04/15 1731    Visit Information   Visit Type First/Initial   Initial Visit   Diabetes Type Type 2   Are you currently following a meal plan? No   Are you taking your medications as prescribed? No  Pt has stopped taking supplements and vitamins due to cost   Date Diagnosed Pt reports 1 week; A1C from Feb 2016 showed diabetes with 6.8 %   Health Coping   How would you rate your overall health? Poor   Psychosocial Assessment   Patient Belief/Attitude about Diabetes Afraid  "not good"   Self-care barriers None   Self-management support Doctor's office;Family   Patient Concerns Nutrition/Meal planning;Weight Control;Glycemic Control;Problem Solving;Monitoring   Preferred Learning Style Visual   Learning Readiness Ready   How often do you need to have someone help you when you read instructions, pamphlets, or other written materials from your doctor or pharmacy? 1 - Never   What is the last grade level you completed in school? 1 year business college   Complications   Last HgB A1C per patient/outside source 6.9 %  05/22/15   How often do you check your blood sugar? 0 times/day (not testing)  Provided One Touch Ultra Mini meter and instructed on use. BG upon return demonstration was 148 mg/dL at 8:112:40 pm - fasting.    Have you had a dilated eye exam in the past 12 months? No   Have you had a dental exam in the past 12 months? No  dentures   Are you checking your feet? Yes   How many days per week are you checking your feet? 7   Dietary Intake   Breakfast skips   Lunch skips   Dinner grilled or oven baked chicken or fish, rice, rarely potatoes   Beverage(s) juice,  water, unsweetened beverages - coffee   Exercise   Exercise Type Light (walking / raking leaves)  [facility]   How many days per week to you exercise? 2   How many minutes per day do you exercise? 90   Total minutes per week of exercise 180   Patient Education   Previous Diabetes Education No   Disease state  Definition of diabetes, type 1 and 2, and the diagnosis of diabetes;Factors that contribute to the development of diabetes   Nutrition management  Role of diet in the treatment of diabetes and the relationship between the three main macronutrients and blood glucose level   Physical activity and exercise  Role of exercise on diabetes management, blood pressure control and cardiac health.   Monitoring Taught/evaluated SMBG meter.;Purpose and frequency of SMBG.;Identified appropriate SMBG and/or A1C goals.   Chronic complications Relationship between chronic complications and blood glucose control   Psychosocial adjustment Identified and addressed patients feelings and concerns about diabetes   Individualized Goals (developed by patient)   Reducing Risk Improve blood sugars Prevent diabetes complications Lose weight   Outcomes   Expected Outcomes Demonstrated interest in learning. Expect positive outcomes      Individualized Plan for Diabetes Self-Management Training:   Learning  Objective:  Patient will have a greater understanding of diabetes self-management. Patient education plan is to attend individual and/or group sessions per assessed needs and concerns.   Plan:   Patient Instructions  Check blood sugars 2 x day before breakfast and 2 hrs after supper 3 x week Exercise: Exercise with [facility] for  90  minutes  2  days a week Avoid sugar sweetened drinks (juices) Eat 3 meals day,  1-2  snacks a day Space meals 4-6 hours apart Don't skip meals Bring blood sugar records to the next  class Call your doctor for a prescription for:  1. Meter strips (type) One Touch Ultra Blue checking  3-4 times per week  2. Lancets (type)       One Touch Delica  checking  3-4  times per week   Expected Outcomes:  Demonstrated interest in learning. Expect positive outcomes  Education material provided:  General Meal Planning Guidelines Meter - One Touch Ultra Mini  If problems or questions, patient to contact team via:   Sharion Settler, RN, CCM, CDE [phone]  Future DSME appointment:  Thursday June 13, 2015 for Class 1

## 2015-06-04 NOTE — Telephone Encounter (Signed)
[name] called. She has been out since 05/20/15 and is having diabetes problems. She plans to return to [facility] next Monday.

## 2015-06-04 NOTE — Patient Instructions (Addendum)
Check blood sugars 2 x day before breakfast and 2 hrs after supper 3 x week Exercise: Exercise with [facility] for  90  minutes  2  days a week Avoid sugar sweetened drinks (juices) Eat 3 meals day,  1-2  snacks a day Space meals 4-6 hours apart Don't skip meals Bring blood sugar records to the next class Call your doctor for a prescription for:  1. Meter strips (type) One Touch Ultra Blue checking  3-4 times per week  2. Lancets (type)       One Touch Delica  checking  3-4  times per week

## 2015-06-10 DIAGNOSIS — M25562 Pain in left knee: Secondary | ICD-10-CM | POA: Diagnosis not present

## 2015-06-10 DIAGNOSIS — J449 Chronic obstructive pulmonary disease, unspecified: Secondary | ICD-10-CM | POA: Diagnosis not present

## 2015-06-10 DIAGNOSIS — E119 Type 2 diabetes mellitus without complications: Secondary | ICD-10-CM | POA: Diagnosis not present

## 2015-06-10 DIAGNOSIS — R0781 Pleurodynia: Secondary | ICD-10-CM | POA: Diagnosis not present

## 2015-06-11 DIAGNOSIS — R0781 Pleurodynia: Secondary | ICD-10-CM | POA: Diagnosis not present

## 2015-06-12 ENCOUNTER — Encounter: Payer: Medicare HMO | Attending: Family Medicine

## 2015-06-12 ENCOUNTER — Telehealth: Payer: Self-pay | Admitting: *Deleted

## 2015-06-12 DIAGNOSIS — J449 Chronic obstructive pulmonary disease, unspecified: Secondary | ICD-10-CM | POA: Insufficient documentation

## 2015-06-12 NOTE — Telephone Encounter (Signed)
Received voice mail from patient to call her. Returned call and she is not able to come to class tomorrow. She reports being sick since Monday. Pt has seen her MD and waiting on test results. She continues to have pain in her side. She reports pp BG on 10/30 was 258 mg/dL. FBG on 10/31 was 172 and pp 156 mg/dL. Pt has been in pain and also reports nausea and diarrhea. Discussed elevation of BG during illness. Classes rescheduled for December 1 series.

## 2015-06-15 DIAGNOSIS — J449 Chronic obstructive pulmonary disease, unspecified: Secondary | ICD-10-CM | POA: Diagnosis not present

## 2015-06-17 ENCOUNTER — Encounter: Payer: Medicare HMO | Admitting: Respiratory Therapy

## 2015-06-17 ENCOUNTER — Encounter: Payer: Self-pay | Admitting: *Deleted

## 2015-06-17 DIAGNOSIS — J449 Chronic obstructive pulmonary disease, unspecified: Secondary | ICD-10-CM

## 2015-06-17 NOTE — Progress Notes (Signed)
Pulmonary Individual Treatment Plan  Patient Details  Name: [name] MRN: [id number] Date of Birth: 1946/05/29 Referring Provider:  [place] Pink, MD  Initial Encounter Date: 04/23/2015  Visit Diagnosis: COPD Moderate  Patient's Home Medications on Admission:  Current outpatient prescriptions:    acetaminophen (TYLENOL) 500 MG tablet, Take 2 tablets by mouth every 6 (six) hours as needed., Disp: , Rfl:    albuterol (PROAIR HFA) 108 (90 BASE) MCG/ACT inhaler, Inhale 2 puffs into the lungs every 6 (six) hours as needed. , Disp: , Rfl:    aspirin (ASPIRIN LOW DOSE) 81 MG tablet, Take 81 mg by mouth daily. , Disp: , Rfl:    atorvastatin (LIPITOR) 80 MG tablet, Take 80 mg by mouth daily., Disp: , Rfl:    clopidogrel (PLAVIX) 75 MG tablet, TAKE 1 TABLET ONE TIME DAILY, Disp: , Rfl:    DULoxetine (CYMBALTA) 60 MG capsule, Take 60 mg by mouth daily. , Disp: , Rfl:    Fluticasone Furoate-Vilanterol (BREO ELLIPTA) 100-25 MCG/INH AEPB, Inhale 1 puff into the lungs daily. , Disp: , Rfl:    lisinopril (PRINIVIL,ZESTRIL) 5 MG tablet, TAKE 1 TABLET ONE TIME DAILY, Disp: , Rfl:    metoprolol (TOPROL-XL) 200 MG 24 hr tablet, Take 200 mg by mouth daily. , Disp: , Rfl:    tiotropium (SPIRIVA) 18 MCG inhalation capsule, Place 18 mcg into inhaler and inhale daily. , Disp: , Rfl:    torsemide (DEMADEX) 20 MG tablet, TAKE 1 TABLET ONE TIME DAILY, Disp: , Rfl:   Past Medical History: Past Medical History  Diagnosis Date   COPD (chronic obstructive pulmonary disease) ([place])    Hyperlipidemia    Hypertension    Diabetes ([place])    Peripheral vascular disease ([place])    Myocardial infarction ([place])     Tobacco Use: History  Smoking status   Former Smoker -- 2.00 packs/day for 47 years   Types: Cigarettes   Quit date: 08/11/2011  Smokeless tobacco   Never Used    Labs: Recent Review Flowsheet Data    Labs for ITP Cardiac and Pulmonary Rehab Latest Ref Rng 07/29/2012    Cholestrol 0-200 mg/dL 109   LDLCALC 0-100 mg/dL 57   HDL 40-60 mg/dL 31(L)   Trlycerides 0-200 mg/dL 105         POCT Glucose      05/01/15 1226 05/06/15 1000         POCT Blood Glucose   Pre-Exercise 73 mg/dL       Post-Exercise 134 mg/dL       Pre-Exercise #2  167 mg/dL      Post-Exercise #2  113 mg/dL         ADL UCSD:     ADL UCSD      04/23/15 1115       ADL UCSD   ADL Phase Entry     SOB Score total 78     Rest 0     Walk 3     Stairs 5     Bath 3     Dress 3     Shop 4         Pulmonary Function Assessment:     Pulmonary Function Assessment - 04/23/15 1115    Pulmonary Function Tests   RV% 117 %   DLCO% 42.5 %   Initial Spirometry Results   FVC% 70 %   FEV1% 45 %   FEV1/FVC Ratio 50   Post Bronchodilator Spirometry Results   FVC%  66.8 %   FEV1% 43.3 %   FEV1/FVC Ratio 50   Breath   Bilateral Breath Sounds Decreased;Clear   Shortness of Breath Yes      Exercise Target Goals:    Exercise Program Goal: Individual exercise prescription set with THRR, safety & activity barriers. Participant demonstrates ability to understand and report RPE using BORG scale, to self-measure pulse accurately, and to acknowledge the importance of the exercise prescription.  Exercise Prescription Goal: Starting with aerobic activity 30 plus minutes a day, 3 days per week for initial exercise prescription. Provide home exercise prescription and guidelines that participant acknowledges understanding prior to discharge.  Activity Barriers & Risk Stratification:     Activity Barriers & Risk Stratification - 04/23/15 1115    Activity Barriers & Risk Stratification   Activity Barriers Deconditioning;Muscular Weakness;Shortness of Breath;Assistive Device   Risk Stratification Moderate      6 Minute Walk:     6 Minute Walk      04/23/15 1648       6 Minute Walk   Phase Initial     Distance 460 feet     Walk Time 5.5 minutes     Resting HR 68 bpm      Resting BP 134/82 mmHg     Max Ex. HR 79 bpm     Max Ex. BP 138/70 mmHg     RPE 18     Perceived Dyspnea  8     Symptoms No        Initial Exercise Prescription:     Initial Exercise Prescription - 04/23/15 1600    Date of Initial Exercise Prescription   Date 04/23/15   Treadmill   MPH 1   Grade 0   Minutes 10   Recumbant Bike   Level 2   RPM 40   Watts 20   Minutes 10   NuStep   Level 2   Watts 40   Minutes 10   Arm Ergometer   Level 1   Watts 10   Minutes 10   REL-XR   Level 2   Watts 40   Minutes 10   Prescription Details   Frequency (times per week) 3   Duration Progress to 30 minutes of continuous aerobic without signs/symptoms of physical distress   Intensity   THRR REST +  30   Ratings of Perceived Exertion 11-15   Perceived Dyspnea 2-4   Progression Continue progressive overload as per policy without signs/symptoms or physical distress.   Resistance Training   Training Prescription Yes   Weight 2   Reps 10-15      Exercise Prescription Changes:     Exercise Prescription Changes      05/01/15 1200 05/08/15 1100 05/13/15 1100 05/20/15 1100 06/10/15 1000   Exercise Review   Progression  Yes  Yes No   Response to Exercise   Blood Pressure (Admit) 142/76 mmHg  110/66 mmHg 110/66 mmHg    Blood Pressure (Exercise)   132/72 mmHg 132/72 mmHg    Blood Pressure (Exit) 128/80 mmHg  120/70 mmHg 120/70 mmHg    Heart Rate (Admit) 72 bpm  65 bpm 65 bpm    Heart Rate (Exercise) 75 bpm  105 bpm 105 bpm    Heart Rate (Exit) 72 bpm  89 bpm 89 bpm    Oxygen Saturation (Admit) 98 %  95 % 95 %    Oxygen Saturation (Exercise) 95 %  97 % 97 %    Oxygen Saturation (  Exit) 99 %  98 % 98 %    Rating of Perceived Exertion (Exercise) '11  13 13    ' Perceived Dyspnea (Exercise) 0  2 2    Symptoms No  No No    Comments  Is very compliant with all exercise increases and goals and appreciates the challenges  The treadmill was bothering the patient's knees so she used the AE  instead today.  Patient has not attended since last review, therefore, no changes warranted.   Duration Progress to 30 minutes of continuous aerobic without signs/symptoms of physical distress Progress to 30 minutes of continuous aerobic without signs/symptoms of physical distress Progress to 30 minutes of continuous aerobic without signs/symptoms of physical distress Progress to 30 minutes of continuous aerobic without signs/symptoms of physical distress Progress to 30 minutes of continuous aerobic without signs/symptoms of physical distress   Intensity Rest + 30 Rest + 30 THRR unchanged THRR unchanged THRR unchanged   Progression Continue progressive overload as per policy without signs/symptoms or physical distress. Continue progressive overload as per policy without signs/symptoms or physical distress.      Resistance Training   Training Prescription Yes Yes Yes Yes Yes   Weight '1 1 1 1 1   ' Reps 10-12 10-12 10-12 10-12 10-12   Interval Training   Interval Training No No No No No   Treadmill   MPH '1 1 1 1 1   ' Grade 0 0 0 0 0   Minutes '10 10 10 10 10   ' NuStep   Level '2 2 2 2 2   ' Watts 20 20 40 40 40   Minutes '10 15 15 15 15   ' Arm Ergometer   Level    1 1   Watts    10 10   Minutes    10 10   Recumbant Elliptical   Level 1  BioStep 2  BioStep 2  BioStep 2  BioStep 2  BioStep   Watts '15 20 20 20 20   ' Minutes '10 15 15 15 15      ' Discharge Exercise Prescription (Final Exercise Prescription Changes):     Exercise Prescription Changes - 06/10/15 1000    Exercise Review   Progression No   Response to Exercise   Comments Patient has not attended since last review, therefore, no changes warranted.   Duration Progress to 30 minutes of continuous aerobic without signs/symptoms of physical distress   Intensity THRR unchanged   Resistance Training   Training Prescription Yes   Weight 1   Reps 10-12   Interval Training   Interval Training No   Treadmill   MPH 1   Grade 0    Minutes 10   NuStep   Level 2   Watts 40   Minutes 15   Arm Ergometer   Level 1   Watts 10   Minutes 10   Recumbant Elliptical   Level 2  BioStep   Watts 20   Minutes 15       Nutrition:  Target Goals: Understanding of nutrition guidelines, daily intake of sodium <1590m, cholesterol <2036m calories 30% from fat and 7% or less from saturated fats, daily to have 5 or more servings of fruits and vegetables.  Biometrics:     Pre Biometrics - 04/23/15 1653    Pre Biometrics   Height '5\' 7"'  (1.702 m)   Waist Circumference 46 inches   Hip Circumference 49.5 inches   Waist to Hip Ratio 0.93 %  Nutrition Therapy Plan and Nutrition Goals:     Nutrition Therapy & Goals - 04/23/15 1115    Nutrition Therapy   Diet Ms Szafran would like to meet with the dietitian; she cooks a healthy meal twice a week and has leftovers; she likes peanut butter sandwiches; very hard to cook for one person; she does dring 8gl of fluid a day      Nutrition Discharge: Rate Your Plate Scores:   Psychosocial: Target Goals: Acknowledge presence or absence of depression, maximize coping skills, provide positive support system. Participant is able to verbalize types and ability to use techniques and skills needed for reducing stress and depression.  Initial Review & Psychosocial Screening:     Initial Psych Review & Screening - 04/23/15 1115    Initial Review   Current issues with Current Depression   Family Dynamics   Good Support System? Yes   Concerns Recent loss of significant other   Comments Ms Burdick is still suffering the loss of her husband of 66years - he died about 45yr ago.  She does have good support from her 2 sons from her first marriage and enjoys her grand children.   Barriers   Psychosocial barriers to participate in program The patient should benefit from training in stress management and relaxation.   Screening Interventions   Interventions Program counselor  consult;Encouraged to exercise      Quality of Life Scores:     Quality of Life - 04/23/15 1115    Quality of Life Scores   Health/Function Pre 9.03 %   Socioeconomic Pre 15.64 %   Psych/Spiritual Pre 9.25 %   Family Pre 25.13 %   GLOBAL Pre 12.53 %      PHQ-9:     Recent Review Flowsheet Data    [facility] 04/23/2015   Decreased Interest 1 1   Down, Depressed, Hopeless 1 1   PHQ - 2 Score 2 2   Altered sleeping 1 1   Tired, decreased energy 1 1   Change in appetite 1 1   Feeling bad or failure about yourself  1 1   Trouble concentrating 0 1   Moving slowly or fidgety/restless 0 0   Suicidal thoughts 0 0   PHQ-9 Score 6 7   Difficult doing work/chores Somewhat difficult Somewhat difficult      Psychosocial Evaluation and Intervention:     Psychosocial Evaluation - 05/01/15 1051    Psychosocial Evaluation & Interventions   Interventions Stress management education;Relaxation education;Encouraged to exercise with the program and follow exercise prescription   Comments Counselor met with Ms. KFieldertoday for initial psychosocial evaluation.  She is a 69year old with COPD who has a strong support system of two adult sons and a sister who live close by.  Ms. KRaliegh Ipis also part of a local church community.  She reports not sleeping well and minimal appetite.  She has tried several OTC sleep aids without success.  Ms. KRaliegh Ipreports losing her spouse to lung cancer 5 years ago and has not slept well since then with maximum of 3-4 hours intermittent sleep per night.  Ms. KRaliegh Ipdenies a history of depression or anxiety, but does exhibit some symptoms of depression currently.  Her mood was a "5" on a scale of 1-10, and she stated if she "lost 25 pounds and got some sleep" she would feel better.  Ms. KRaliegh Iphas goals to lose weight, breathe better and be able to  line dance with her sister and her son as her goals for this program.  Counselor encouraged her to participate in the  depression education today, as well as speak to her Dr. or pharmacist about a natural OTC sleep aid that is sustained release.  Counselor also provided a local therapist contact information for Ms. Quest and will follow up re: these recommendations.     Continued Psychosocial Services Needed Yes  Follow up on sleep, and recommendations for talk therapy to help with unresolved grief and loss issues.  She will benefit from meeting with the dietician for weight loss goals as well as participating in all of the psychoeducational components here.       Psychosocial Re-Evaluation:  Education: Education Goals: Education classes will be provided on a weekly basis, covering required topics. Participant will state understanding/return demonstration of topics presented.  Learning Barriers/Preferences:     Learning Barriers/Preferences - 04/23/15 1115    Learning Barriers/Preferences   Learning Barriers None   Learning Preferences Group Instruction;Individual Instruction;Pictoral;Skilled Demonstration;Verbal Instruction;Video;Written Material      Education Topics: Initial Evaluation Education: - Verbal, written and demonstration of respiratory meds, RPE/PD scales, oximetry and breathing techniques. Instruction on use of nebulizers and MDIs: cleaning and proper use, rinsing mouth with steroid doses and importance of monitoring MDI activations.          Pulmonary Rehab from 05/20/2015 in [place]   Date  04/23/15   Educator  LB   Instruction Review Code  2- meets goals/outcomes      General Nutrition Guidelines/Fats and Fiber: -Group instruction provided by verbal, written material, models and posters to present the general guidelines for heart healthy nutrition. Gives an explanation and review of dietary fats and fiber.      Pulmonary Rehab from 05/20/2015 in [place]   Date  05/20/15   Educator  PI   Instruction  Review Code  2- meets goals/outcomes      Controlling Sodium/Reading Food Labels: -Group verbal and written material supporting the discussion of sodium use in heart healthy nutrition. Review and explanation with models, verbal and written materials for utilization of the food label.   Exercise Physiology & Risk Factors: - Group verbal and written instruction with models to review the exercise physiology of the cardiovascular system and associated critical values. Details cardiovascular disease risk factors and the goals associated with each risk factor.   Aerobic Exercise & Resistance Training: - Gives group verbal and written discussion on the health impact of inactivity. On the components of aerobic and resistive training programs and the benefits of this training and how to safely progress through these programs.   Flexibility, Balance, General Exercise Guidelines: - Provides group verbal and written instruction on the benefits of flexibility and balance training programs. Provides general exercise guidelines with specific guidelines to those with heart or lung disease. Demonstration and skill practice provided.   Stress Management: - Provides group verbal and written instruction about the health risks of elevated stress, cause of high stress, and healthy ways to reduce stress.   Depression: - Provides group verbal and written instruction on the correlation between heart/lung disease and depressed mood, treatment options, and the stigmas associated with seeking treatment.      Pulmonary Rehab from 05/20/2015 in [place]   Date  05/01/15   Educator  [facility]   Instruction Review Code  2- meets goals/outcomes  Exercise & Equipment Safety: - Individual verbal instruction and demonstration of equipment use and safety with use of the equipment.      Pulmonary Rehab from 05/20/2015 in [place]   Date   05/01/15   Educator  LB   Instruction Review Code  2- meets goals/outcomes      Infection Prevention: - Provides verbal and written material to individual with discussion of infection control including proper hand washing and proper equipment cleaning during exercise session.      Pulmonary Rehab from 05/20/2015 in [place]   Date  05/01/15   Educator  LB   Instruction Review Code  2- meets goals/outcomes      Falls Prevention: - Provides verbal and written material to individual with discussion of falls prevention and safety.      Pulmonary Rehab from 05/20/2015 in [place]   Date  04/23/15   Educator  LB   Instruction Review Code  2- meets goals/outcomes      Diabetes: - Individual verbal and written instruction to review signs/symptoms of diabetes, desired ranges of glucose level fasting, after meals and with exercise. Advice that pre and post exercise glucose checks will be done for 3 sessions at entry of program.      Pulmonary Rehab from 05/20/2015 in [place]   Date  05/01/15   Educator  LB   Instruction Review Code  2- meets goals/outcomes      Chronic Lung Diseases: - Group verbal and written instruction to review new updates, new respiratory medications, new advancements in procedures and treatments. Provide informative websites and "800" numbers of self-education.   Lung Procedures: - Group verbal and written instruction to describe testing methods done to diagnose lung disease. Review the outcome of test results. Describe the treatment choices: Pulmonary Function Tests, ABGs and oximetry.   Energy Conservation: - Provide group verbal and written instruction for methods to conserve energy, plan and organize activities. Instruct on pacing techniques, use of adaptive equipment and posture/positioning to relieve shortness of  breath.   Triggers: - Group verbal and written instruction to review types of environmental controls: home humidity, furnaces, filters, dust mite/pet prevention, HEPA vacuums. To discuss weather changes, air quality and the benefits of nasal washing.   Exacerbations: - Group verbal and written instruction to provide: warning signs, infection symptoms, calling MD promptly, preventive modes, and value of vaccinations. Review: effective airway clearance, coughing and/or vibration techniques. Create an Sports administrator.   Oxygen: - Individual and group verbal and written instruction on oxygen therapy. Includes supplement oxygen, available portable oxygen systems, continuous and intermittent flow rates, oxygen safety, concentrators, and Medicare reimbursement for oxygen.      Pulmonary Rehab from 05/20/2015 in [place]   Date  04/23/15   Educator  lb   Instruction Review Code  2- meets goals/outcomes      Respiratory Medications: - Group verbal and written instruction to review medications for lung disease. Drug class, frequency, complications, importance of spacers, rinsing mouth after steroid MDI's, and proper cleaning methods for nebulizers.      Pulmonary Rehab from 05/20/2015 in [place]   Date  04/23/15   Educator  LB   Instruction Review Code  2- meets goals/outcomes      AED/CPR: - Group verbal and written instruction with the use of models to demonstrate the basic  use of the AED with the basic ABC's of resuscitation.   Breathing Retraining: - Provides individuals verbal and written instruction on purpose, frequency, and proper technique of diaphragmatic breathing and pursed-lipped breathing. Applies individual practice skills.      Pulmonary Rehab from 05/20/2015 in [place]   Date  04/23/15   Educator  LB   Instruction Review Code  2- meets goals/outcomes       Anatomy and Physiology of the Lungs: - Group verbal and written instruction with the use of models to provide basic lung anatomy and physiology related to function, structure and complications of lung disease.   Heart Failure: - Group verbal and written instruction on the basics of heart failure: signs/symptoms, treatments, explanation of ejection fraction, enlarged heart and cardiomyopathy.   Sleep Apnea: - Individual verbal and written instruction to review Obstructive Sleep Apnea. Review of risk factors, methods for diagnosing and types of masks and machines for OSA.   Anxiety: - Provides group, verbal and written instruction on the correlation between heart/lung disease and anxiety, treatment options, and management of anxiety.   Relaxation: - Provides group, verbal and written instruction about the benefits of relaxation for patients with heart/lung disease. Also provides patients with examples of relaxation techniques.   Knowledge Questionnaire Score:     Knowledge Questionnaire Score - 04/23/15 1115    Knowledge Questionnaire Score   Pre Score -1      Personal Goals and Risk Factors at Admission:     Personal Goals and Risk Factors at Admission - 04/23/15 1115    Personal Goals and Risk Factors on Admission    Weight Management Yes   Intervention Learn and follow the exercise and diet guidelines while in the program. Utilize the nutrition and education classes to help gain knowledge of the diet and exercise expectations in the program  Ms Sidener would like to meet with the dietitian; she cooks a healthy meal twice a week and has leftovers; she likes peanut butter sandwiches; very hard to cook for one person; she does dring 8gl of fluid a day   Goal Weight 150 lb (68.04 kg)   Increase Aerobic Exercise and Physical Activity Yes   Intervention While in program, learn and follow the exercise prescription taught. Start at a low level workload and increase workload after  able to maintain previous level for 30 minutes. Increase time before increasing intensity.  Ms Cunanan has never been so out of shape and is looking forward to a change in her lifestyle with exercise a part of it. Plan to check if she has [employer] research officer, government.   Understand more about Heart/Pulmonary Disease. Yes   Intervention While in program utilize professionals for any questions, and attend the education sessions. Great websites to use are www.americanheart.org or www.lung.org for reliable information.  Ms Moise is interested in education about COPD and daily management of her disease.   Improve shortness of breath with ADL's Yes   Intervention While in program, learn and follow the exercise prescription taught. Start at a low level workload and increase workload ad advised by the exercise physiologist. Increase time before increasing intensity.  Ms Chrisley would like to improve her shortness of breath, especially with housework.   Develop more efficient breathing techniques such as purse lipped breathing and diaphragmatic breathing; and practicing self-pacing with activity Yes   Intervention While in program, learn and utilize the specific breathing techniques taught to you. Continue to practice and use the techniques  as needed.   Increase knowledge of respiratory medications and ability to use respiratory devices properly.  Yes   Intervention While in program learn and demonstrate appropriate use of your oxygen therapy by increasing flow with exertion, manage oxygen tank operation, including continuous and intermittent flow.  Understanding oxygen is a drug ordered by your physician.;While in program, learn to administer MDI, nebulizer, and spacer properly.;Learn to take respiratory medicine as ordered.;While in program, learn to Clean MDI, nebulizers, and spacers properly.  Ms Zinda uses [place], Spiriva, and Ventolin for inhalers. I gave her a spacer for her Ventolin MDI   with instructions. She is on 2l/m oxygen  from [employer], and her portable system is a concentrator at intermittent flow.   Diabetes Yes   Goal Blood glucose control identified by blood glucose values, HgbA1C. Participant verbalizes understanding of the signs/symptoms of hyper/hypo glycemia, proper foot care and importance of medication and nutrition plan for blood glucose control.   Intervention Provide nutrition & aerobic exercise along with prescribed medications to achieve blood glucose in normal ranges: Fasting 65-99 mg/dL   Hypertension Yes   Goal Participant will see blood pressure controlled within the values of 140/77m/Hg or within value directed by their physician.   Intervention Provide nutrition & aerobic exercise along with prescribed medications to achieve BP 140/90 or less.      Personal Goals and Risk Factors Review:      Goals and Risk Factor Review      05/01/15 1000 05/06/15 1000 05/08/15 1107 05/14/15 0639     Increase Aerobic Exercise and Physical Activity   Goals Progress/Improvement seen   Yes Yes     Comments  Ms KPetrasis working on increasing her exercise goals in [facility] and has increased her exercise time from 449mutes to 5072m. [facility] also [facility] will start that [facility]. Reviewed individualized exercise prescription and made increases per departmental policy. Exercise increases were discussed with the patient and they were able to perform the new work loads without issue (no signs or symptoms).      Improve shortness of breath with ADL's   Goals Progress/Improvement seen   Yes      Comments  Ms KinRuperts good PD scores with her exercise and will encourage her to continue increasing her intensiy with her exercise goals.      Diabetes   Goal --  Ms KinAuberts a low glucose before [facility] class. She ate at  4:00am, so I educated her on snacking before class and  a good snack with protein. --  Second reading pre and post exercise: 167 and post 113;  --  Ms Lubin's glucose level  was checked 3 sessions before exercise and after exercise. This record she can show Dr HedKary Kosd encouraged her to have an A!C drawn.    Hypertension   Goal  --  Maintaining acceptable BP at rest and Exercise in [facility]  --  Acceptable BP with exercise and at rest in Lung       Personal Goals Discharge (Final Personal Goals and Risk Factors Review):      Goals and Risk Factor Review - 05/14/15 0639    Diabetes   Goal --  Ms Herringshaw's glucose level was checked 3 sessions before exercise and after exercise. This record she can show Dr HedKary Kosd encouraged her to have an A!C drawn.   Hypertension   Goal --  Acceptable BP with exercise and at rest  in Lung      Comments: 30 day note review; Ms Doyle last attended 05/20/2015; she has been having problems with her diabete and will return as soon as possible.

## 2015-06-17 NOTE — Progress Notes (Signed)
Pulmonary Individual Treatment Plan  Patient Details  Name: TERRI RORRER MRN: [id number] Date of Birth: 02-Jan-1946 Referring Provider:  Dr. Jamas Lav  Initial Encounter Date:  04/23/15  Visit Diagnosis: COPD, moderate ([place])  Patient's Home Medications on Admission:  Current outpatient prescriptions:  .  acetaminophen (TYLENOL) 500 MG tablet, Take 2 tablets by mouth every 6 (six) hours as needed., Disp: , Rfl:  .  albuterol (PROAIR HFA) 108 (90 BASE) MCG/ACT inhaler, Inhale 2 puffs into the lungs every 6 (six) hours as needed. , Disp: , Rfl:  .  aspirin (ASPIRIN LOW DOSE) 81 MG tablet, Take 81 mg by mouth daily. , Disp: , Rfl:  .  atorvastatin (LIPITOR) 80 MG tablet, Take 80 mg by mouth daily., Disp: , Rfl:  .  clopidogrel (PLAVIX) 75 MG tablet, TAKE 1 TABLET ONE TIME DAILY, Disp: , Rfl:  .  DULoxetine (CYMBALTA) 60 MG capsule, Take 60 mg by mouth daily. , Disp: , Rfl:  .  Fluticasone Furoate-Vilanterol (BREO ELLIPTA) 100-25 MCG/INH AEPB, Inhale 1 puff into the lungs daily. , Disp: , Rfl:  .  lisinopril (PRINIVIL,ZESTRIL) 5 MG tablet, TAKE 1 TABLET ONE TIME DAILY, Disp: , Rfl:  .  metoprolol (TOPROL-XL) 200 MG 24 hr tablet, Take 200 mg by mouth daily. , Disp: , Rfl:  .  tiotropium (SPIRIVA) 18 MCG inhalation capsule, Place 18 mcg into inhaler and inhale daily. , Disp: , Rfl:  .  torsemide (DEMADEX) 20 MG tablet, TAKE 1 TABLET ONE TIME DAILY, Disp: , Rfl:   Past Medical History: Past Medical History  Diagnosis Date  . COPD (chronic obstructive pulmonary disease) ([place])   . Hyperlipidemia   . Hypertension   . Diabetes ([place])   . Peripheral vascular disease ([place])   . Myocardial infarction ([place])     Tobacco Use: History  Smoking status  . Former Smoker -- 2.00 packs/day for 47 years  . Types: Cigarettes  . Quit date: 08/11/2011  Smokeless tobacco  . Never Used    Labs: Recent Review Flowsheet Data    Labs for ITP Cardiac and Pulmonary Rehab Latest Ref Rng 07/29/2012    Cholestrol 0-200 mg/dL 109   LDLCALC 0-100 mg/dL 57   HDL 40-60 mg/dL 31(L)   Trlycerides 0-200 mg/dL 105         POCT Glucose      05/01/15 1226 05/06/15 1000         POCT Blood Glucose   Pre-Exercise 73 mg/dL       Post-Exercise 134 mg/dL       Pre-Exercise #2  167 mg/dL      Post-Exercise #2  113 mg/dL         ADL UCSD:     ADL UCSD      04/23/15 1115       ADL UCSD   ADL Phase Entry     SOB Score total 78     Rest 0     Walk 3     Stairs 5     Bath 3     Dress 3     Shop 4         Pulmonary Function Assessment:     Pulmonary Function Assessment - 04/23/15 1115    Pulmonary Function Tests   RV% 117 %   DLCO% 42.5 %   Initial Spirometry Results   FVC% 70 %   FEV1% 45 %   FEV1/FVC Ratio 50   Post Bronchodilator Spirometry Results  FVC% 66.8 %   FEV1% 43.3 %   FEV1/FVC Ratio 50   Breath   Bilateral Breath Sounds Decreased;Clear   Shortness of Breath Yes      Exercise Target Goals:    Exercise Program Goal: Individual exercise prescription set with THRR, safety & activity barriers. Participant demonstrates ability to understand and report RPE using BORG scale, to self-measure pulse accurately, and to acknowledge the importance of the exercise prescription.  Exercise Prescription Goal: Starting with aerobic activity 30 plus minutes a day, 3 days per week for initial exercise prescription. Provide home exercise prescription and guidelines that participant acknowledges understanding prior to discharge.  Activity Barriers & Risk Stratification:     Activity Barriers & Risk Stratification - 04/23/15 1115    Activity Barriers & Risk Stratification   Activity Barriers Deconditioning;Muscular Weakness;Shortness of Breath;Assistive Device   Risk Stratification Moderate      6 Minute Walk:     6 Minute Walk      04/23/15 1648       6 Minute Walk   Phase Initial     Distance 460 feet     Walk Time 5.5 minutes     Resting HR 68 bpm      Resting BP 134/82 mmHg     Max Ex. HR 79 bpm     Max Ex. BP 138/70 mmHg     RPE 18     Perceived Dyspnea  8     Symptoms No        Initial Exercise Prescription:     Initial Exercise Prescription - 04/23/15 1600    Date of Initial Exercise Prescription   Date 04/23/15   Treadmill   MPH 1   Grade 0   Minutes 10   Recumbant Bike   Level 2   RPM 40   Watts 20   Minutes 10   NuStep   Level 2   Watts 40   Minutes 10   Arm Ergometer   Level 1   Watts 10   Minutes 10   REL-XR   Level 2   Watts 40   Minutes 10   Prescription Details   Frequency (times per week) 3   Duration Progress to 30 minutes of continuous aerobic without signs/symptoms of physical distress   Intensity   THRR REST +  30   Ratings of Perceived Exertion 11-15   Perceived Dyspnea 2-4   Progression Continue progressive overload as per policy without signs/symptoms or physical distress.   Resistance Training   Training Prescription Yes   Weight 2   Reps 10-15      Exercise Prescription Changes:     Exercise Prescription Changes      05/01/15 1200 05/08/15 1100 05/13/15 1100 05/20/15 1100 06/10/15 1000   Exercise Review   Progression  Yes  Yes No   Response to Exercise   Blood Pressure (Admit) 142/76 mmHg  110/66 mmHg 110/66 mmHg    Blood Pressure (Exercise)   132/72 mmHg 132/72 mmHg    Blood Pressure (Exit) 128/80 mmHg  120/70 mmHg 120/70 mmHg    Heart Rate (Admit) 72 bpm  65 bpm 65 bpm    Heart Rate (Exercise) 75 bpm  105 bpm 105 bpm    Heart Rate (Exit) 72 bpm  89 bpm 89 bpm    Oxygen Saturation (Admit) 98 %  95 % 95 %    Oxygen Saturation (Exercise) 95 %  97 % 97 %    Oxygen  Saturation (Exit) 99 %  98 % 98 %    Rating of Perceived Exertion (Exercise) _0 Perceived Dyspnea (Exercise) 0  2 2    Symptoms No  No No    Comments  Is very compliant with all exercise increases and goals and appreciates the challenges  The treadmill was bothering the patient's knees so she used the AE  instead today.  Patient has not attended since last review, therefore, no changes warranted.   Duration Progress to 30 minutes of continuous aerobic without signs/symptoms of physical distress Progress to 30 minutes of continuous aerobic without signs/symptoms of physical distress Progress to 30 minutes of continuous aerobic without signs/symptoms of physical distress Progress to 30 minutes of continuous aerobic without signs/symptoms of physical distress Progress to 30 minutes of continuous aerobic without signs/symptoms of physical distress   Intensity Rest + 30 Rest + 30 THRR unchanged THRR unchanged THRR unchanged   Progression Continue progressive overload as per policy without signs/symptoms or physical distress. Continue progressive overload as per policy without signs/symptoms or physical distress.      Resistance Training   Training Prescription _1    Weight _2 Reps 10-12 10-12 10-12 10-12 10-12   Interval Training   Interval Training _3    Treadmill   MPH _4 Grade 0 0 0 0 0   Minutes _5 NuStep   Level _6 Watts 20 20 40 40 40   Minutes _7 Arm Ergometer   Level    1 1   Watts    10 10   Minutes    10 10   Recumbant Elliptical   Level 1  BioStep 2  BioStep 2  BioStep 2  BioStep 2  BioStep   Watts _8 Minutes _9 Discharge Exercise Prescription (Final Exercise Prescription Changes):     Exercise Prescription Changes - 06/10/15 1000    Exercise Review   Progression No   Response to Exercise   Comments Patient has not attended since last review, therefore, no changes warranted.   Duration Progress to 30 minutes of continuous aerobic without signs/symptoms of physical distress   Intensity THRR unchanged   Resistance Training   Training Prescription Yes   Weight 1   Reps 10-12   Interval Training   Interval Training No   Treadmill   MPH 1   Grade 0    Minutes 10   NuStep   Level 2   Watts 40   Minutes 15   Arm Ergometer   Level 1   Watts 10   Minutes 10   Recumbant Elliptical   Level 2  BioStep   Watts 20   Minutes 15       Nutrition:  Target Goals: Understanding of nutrition guidelines, daily intake of sodium <1544m, cholesterol <2025m calories 30% from fat and 7% or less from saturated fats, daily to have 5 or more servings of fruits and vegetables.  Biometrics:     Pre Biometrics - 04/23/15 1653    Pre Biometrics   Height _10  (1.702 m)   Waist Circumference 46 inches   Hip Circumference 49.5 inches   Waist to Hip Ratio 0.93 %  Nutrition Therapy Plan and Nutrition Goals:     Nutrition Therapy & Goals - 04/23/15 1115    Nutrition Therapy   Diet Ms Zumwalt would like to meet with the dietitian; she cooks a healthy meal twice a week and has leftovers; she likes peanut butter sandwiches; very hard to cook for one person; she does dring 8gl of fluid a day      Nutrition Discharge: Rate Your Plate Scores:   Psychosocial: Target Goals: Acknowledge presence or absence of depression, maximize coping skills, provide positive support system. Participant is able to verbalize types and ability to use techniques and skills needed for reducing stress and depression.  Initial Review & Psychosocial Screening:     Initial Psych Review & Screening - 04/23/15 1115    Initial Review   Current issues with Current Depression   Family Dynamics   Good Support System? Yes   Concerns Recent loss of significant other   Comments Ms Bonneau is still suffering the loss of her husband of 56years - he died about 17yr ago.  She does have good support from her 2 sons from her first marriage and enjoys her grand children.   Barriers   Psychosocial barriers to participate in program The patient should benefit from training in stress management and relaxation.   Screening Interventions   Interventions Program counselor  consult;Encouraged to exercise      Quality of Life Scores:     Quality of Life - 04/23/15 1115    Quality of Life Scores   Health/Function Pre 9.03 %   Socioeconomic Pre 15.64 %   Psych/Spiritual Pre 9.25 %   Family Pre 25.13 %   GLOBAL Pre 12.53 %      PHQ-9:     Recent Review Flowsheet Data    [facility] 06/04/2015 04/23/2015   Decreased Interest 1 1   Down, Depressed, Hopeless 1 1   PHQ - 2 Score 2 2   Altered sleeping 1 1   Tired, decreased energy 1 1   Change in appetite 1 1   Feeling bad or failure about yourself  1 1   Trouble concentrating 0 1   Moving slowly or fidgety/restless 0 0   Suicidal thoughts 0 0   PHQ-9 Score 6 7   Difficult doing work/chores Somewhat difficult Somewhat difficult      Psychosocial Evaluation and Intervention:     Psychosocial Evaluation - 05/01/15 1051    Psychosocial Evaluation & Interventions   Interventions Stress management education;Relaxation education;Encouraged to exercise with the program and follow exercise prescription   Comments Counselor met with Ms. KEbeytoday for initial psychosocial evaluation.  She is a 69year old with COPD who has a strong support system of two adult sons and a sister who live close by.  Ms. KRaliegh Ipis also part of a local church community.  She reports not sleeping well and minimal appetite.  She has tried several OTC sleep aids without success.  Ms. KRaliegh Ipreports losing her spouse to lung cancer 5 years ago and has not slept well since then with maximum of 3-4 hours intermittent sleep per night.  Ms. KRaliegh Ipdenies a history of depression or anxiety, but does exhibit some symptoms of depression currently.  Her mood was a "5" on a scale of 1-10, and she stated if she "lost 25 pounds and got some sleep" she would feel better.  Ms. KRaliegh Iphas goals to lose weight, breathe better and be able to  line dance with her sister and her son as her goals for this program.  Counselor encouraged her to participate in the  depression education today, as well as speak to her Dr. or pharmacist about a natural OTC sleep aid that is sustained release.  Counselor also provided a local therapist contact information for Ms. Walski and will follow up re: these recommendations.     Continued Psychosocial Services Needed Yes  Follow up on sleep, and recommendations for talk therapy to help with unresolved grief and loss issues.  She will benefit from meeting with the dietician for weight loss goals as well as participating in all of the psychoeducational components here.       Psychosocial Re-Evaluation:  Education: Education Goals: Education classes will be provided on a weekly basis, covering required topics. Participant will state understanding/return demonstration of topics presented.  Learning Barriers/Preferences:     Learning Barriers/Preferences - 04/23/15 1115    Learning Barriers/Preferences   Learning Barriers None   Learning Preferences Group Instruction;Individual Instruction;Pictoral;Skilled Demonstration;Verbal Instruction;Video;Written Material      Education Topics: Initial Evaluation Education: - Verbal, written and demonstration of respiratory meds, RPE/PD scales, oximetry and breathing techniques. Instruction on use of nebulizers and MDIs: cleaning and proper use, rinsing mouth with steroid doses and importance of monitoring MDI activations.          Pulmonary Rehab from 05/20/2015 in [place]   Date  04/23/15   Educator  LB   Instruction Review Code  2- meets goals/outcomes      General Nutrition Guidelines/Fats and Fiber: -Group instruction provided by verbal, written material, models and posters to present the general guidelines for heart healthy nutrition. Gives an explanation and review of dietary fats and fiber.      Pulmonary Rehab from 05/20/2015 in [place]   Date  05/20/15   Educator  PI   Instruction  Review Code  2- meets goals/outcomes      Controlling Sodium/Reading Food Labels: -Group verbal and written material supporting the discussion of sodium use in heart healthy nutrition. Review and explanation with models, verbal and written materials for utilization of the food label.   Exercise Physiology & Risk Factors: - Group verbal and written instruction with models to review the exercise physiology of the cardiovascular system and associated critical values. Details cardiovascular disease risk factors and the goals associated with each risk factor.   Aerobic Exercise & Resistance Training: - Gives group verbal and written discussion on the health impact of inactivity. On the components of aerobic and resistive training programs and the benefits of this training and how to safely progress through these programs.   Flexibility, Balance, General Exercise Guidelines: - Provides group verbal and written instruction on the benefits of flexibility and balance training programs. Provides general exercise guidelines with specific guidelines to those with heart or lung disease. Demonstration and skill practice provided.   Stress Management: - Provides group verbal and written instruction about the health risks of elevated stress, cause of high stress, and healthy ways to reduce stress.   Depression: - Provides group verbal and written instruction on the correlation between heart/lung disease and depressed mood, treatment options, and the stigmas associated with seeking treatment.      Pulmonary Rehab from 05/20/2015 in [place]   Date  05/01/15   Educator  [facility]   Instruction Review Code  2- meets goals/outcomes  Exercise & Equipment Safety: - Individual verbal instruction and demonstration of equipment use and safety with use of the equipment.      Pulmonary Rehab from 05/20/2015 in [place]   Date   05/01/15   Educator  LB   Instruction Review Code  2- meets goals/outcomes      Infection Prevention: - Provides verbal and written material to individual with discussion of infection control including proper hand washing and proper equipment cleaning during exercise session.      Pulmonary Rehab from 05/20/2015 in [place]   Date  05/01/15   Educator  LB   Instruction Review Code  2- meets goals/outcomes      Falls Prevention: - Provides verbal and written material to individual with discussion of falls prevention and safety.      Pulmonary Rehab from 05/20/2015 in [place]   Date  04/23/15   Educator  LB   Instruction Review Code  2- meets goals/outcomes      Diabetes: - Individual verbal and written instruction to review signs/symptoms of diabetes, desired ranges of glucose level fasting, after meals and with exercise. Advice that pre and post exercise glucose checks will be done for 3 sessions at entry of program.      Pulmonary Rehab from 05/20/2015 in [place]   Date  05/01/15   Educator  LB   Instruction Review Code  2- meets goals/outcomes      Chronic Lung Diseases: - Group verbal and written instruction to review new updates, new respiratory medications, new advancements in procedures and treatments. Provide informative websites and "800" numbers of self-education.   Lung Procedures: - Group verbal and written instruction to describe testing methods done to diagnose lung disease. Review the outcome of test results. Describe the treatment choices: Pulmonary Function Tests, ABGs and oximetry.   Energy Conservation: - Provide group verbal and written instruction for methods to conserve energy, plan and organize activities. Instruct on pacing techniques, use of adaptive equipment and posture/positioning to relieve shortness of  breath.   Triggers: - Group verbal and written instruction to review types of environmental controls: home humidity, furnaces, filters, dust mite/pet prevention, HEPA vacuums. To discuss weather changes, air quality and the benefits of nasal washing.   Exacerbations: - Group verbal and written instruction to provide: warning signs, infection symptoms, calling MD promptly, preventive modes, and value of vaccinations. Review: effective airway clearance, coughing and/or vibration techniques. Create an Sports administrator.   Oxygen: - Individual and group verbal and written instruction on oxygen therapy. Includes supplement oxygen, available portable oxygen systems, continuous and intermittent flow rates, oxygen safety, concentrators, and Medicare reimbursement for oxygen.      Pulmonary Rehab from 05/20/2015 in [place]   Date  04/23/15   Educator  lb   Instruction Review Code  2- meets goals/outcomes      Respiratory Medications: - Group verbal and written instruction to review medications for lung disease. Drug class, frequency, complications, importance of spacers, rinsing mouth after steroid MDI's, and proper cleaning methods for nebulizers.      Pulmonary Rehab from 05/20/2015 in [place]   Date  04/23/15   Educator  LB   Instruction Review Code  2- meets goals/outcomes      AED/CPR: - Group verbal and written instruction with the use of models to demonstrate the basic  use of the AED with the basic ABC's of resuscitation.   Breathing Retraining: - Provides individuals verbal and written instruction on purpose, frequency, and proper technique of diaphragmatic breathing and pursed-lipped breathing. Applies individual practice skills.      Pulmonary Rehab from 05/20/2015 in [place]   Date  04/23/15   Educator  LB   Instruction Review Code  2- meets goals/outcomes       Anatomy and Physiology of the Lungs: - Group verbal and written instruction with the use of models to provide basic lung anatomy and physiology related to function, structure and complications of lung disease.   Heart Failure: - Group verbal and written instruction on the basics of heart failure: signs/symptoms, treatments, explanation of ejection fraction, enlarged heart and cardiomyopathy.   Sleep Apnea: - Individual verbal and written instruction to review Obstructive Sleep Apnea. Review of risk factors, methods for diagnosing and types of masks and machines for OSA.   Anxiety: - Provides group, verbal and written instruction on the correlation between heart/lung disease and anxiety, treatment options, and management of anxiety.   Relaxation: - Provides group, verbal and written instruction about the benefits of relaxation for patients with heart/lung disease. Also provides patients with examples of relaxation techniques.   Knowledge Questionnaire Score:     Knowledge Questionnaire Score - 04/23/15 1115    Knowledge Questionnaire Score   Pre Score -1      Personal Goals and Risk Factors at Admission:     Personal Goals and Risk Factors at Admission - 04/23/15 1115    Personal Goals and Risk Factors on Admission    Weight Management Yes   Intervention Learn and follow the exercise and diet guidelines while in the program. Utilize the nutrition and education classes to help gain knowledge of the diet and exercise expectations in the program  Ms Sheriff would like to meet with the dietitian; she cooks a healthy meal twice a week and has leftovers; she likes peanut butter sandwiches; very hard to cook for one person; she does dring 8gl of fluid a day   Goal Weight 150 lb (68.04 kg)   Increase Aerobic Exercise and Physical Activity Yes   Intervention While in program, learn and follow the exercise prescription taught. Start at a low level workload and increase workload after  able to maintain previous level for 30 minutes. Increase time before increasing intensity.  Ms Yount has never been so out of shape and is looking forward to a change in her lifestyle with exercise a part of it. Plan to check if she has [employer] research officer, government.   Understand more about Heart/Pulmonary Disease. Yes   Intervention While in program utilize professionals for any questions, and attend the education sessions. Great websites to use are www.americanheart.org or www.lung.org for reliable information.  Ms Bjelland is interested in education about COPD and daily management of her disease.   Improve shortness of breath with ADL's Yes   Intervention While in program, learn and follow the exercise prescription taught. Start at a low level workload and increase workload ad advised by the exercise physiologist. Increase time before increasing intensity.  Ms Slabaugh would like to improve her shortness of breath, especially with housework.   Develop more efficient breathing techniques such as purse lipped breathing and diaphragmatic breathing; and practicing self-pacing with activity Yes   Intervention While in program, learn and utilize the specific breathing techniques taught to you. Continue to practice and use the techniques  as needed.   Increase knowledge of respiratory medications and ability to use respiratory devices properly.  Yes   Intervention While in program learn and demonstrate appropriate use of your oxygen therapy by increasing flow with exertion, manage oxygen tank operation, including continuous and intermittent flow.  Understanding oxygen is a drug ordered by your physician.;While in program, learn to administer MDI, nebulizer, and spacer properly.;Learn to take respiratory medicine as ordered.;While in program, learn to Clean MDI, nebulizers, and spacers properly.  Ms Fodera uses [place], Spiriva, and Ventolin for inhalers. I gave her a spacer for her Ventolin MDI   with instructions. She is on 2l/m oxygen  from [employer], and her portable system is a concentrator at intermittent flow.   Diabetes Yes   Goal Blood glucose control identified by blood glucose values, HgbA1C. Participant verbalizes understanding of the signs/symptoms of hyper/hypo glycemia, proper foot care and importance of medication and nutrition plan for blood glucose control.   Intervention Provide nutrition & aerobic exercise along with prescribed medications to achieve blood glucose in normal ranges: Fasting 65-99 mg/dL   Hypertension Yes   Goal Participant will see blood pressure controlled within the values of 140/28m/Hg or within value directed by their physician.   Intervention Provide nutrition & aerobic exercise along with prescribed medications to achieve BP 140/90 or less.      Personal Goals and Risk Factors Review:      Goals and Risk Factor Review      05/01/15 1000 05/06/15 1000 05/08/15 1107 05/14/15 0639     Increase Aerobic Exercise and Physical Activity   Goals Progress/Improvement seen   Yes Yes     Comments  Ms KPoulteris working on increasing her exercise goals in [facility] and has increased her exercise time from 42mutes to 5085m. [facility]. Reviewed individualized exercise prescription and made increases per departmental policy. Exercise increases were discussed with the patient and they were able to perform the new work loads without issue (no signs or symptoms).      Improve shortness of breath with ADL's   Goals Progress/Improvement seen   Yes      Comments  Ms KinNovakovichs good PD scores with her exercise and will encourage her to continue increasing her intensiy with her exercise goals.      Diabetes   Goal --  Ms KinVieles a low glucose before [facility] class. She ate at  4:00am, so I educated her on snacking before class and  a good snack with protein. --  Second reading pre and post exercise: 167 and post 113;  --  Ms Friscia's glucose level  was checked 3 sessions before exercise and after exercise. This record she can show Dr HedKary Kosd encouraged her to have an A!C drawn.    Hypertension   Goal  --  Maintaining acceptable BP at rest and Exercise in [facility]  --  Acceptable BP with exercise and at rest in Lung       Personal Goals Discharge (Final Personal Goals and Risk Factors Review):      Goals and Risk Factor Review - 05/14/15 0639    Diabetes   Goal --  Ms Tennis's glucose level was checked 3 sessions before exercise and after exercise. This record she can show Dr HedKary Kosd encouraged her to have an A!C drawn.   Hypertension   Goal --  Acceptable BP with exercise and at rest  in Lung      Comments: 30 Day Review

## 2015-06-28 DIAGNOSIS — J449 Chronic obstructive pulmonary disease, unspecified: Secondary | ICD-10-CM | POA: Diagnosis not present

## 2015-07-10 ENCOUNTER — Telehealth: Payer: Self-pay | Admitting: Respiratory Therapy

## 2015-07-10 NOTE — Telephone Encounter (Signed)
Called Ms [name] to check on her ; she last attended 05/20/2015; left a message

## 2015-07-11 ENCOUNTER — Encounter: Payer: Medicare HMO | Attending: Family Medicine | Admitting: Dietician

## 2015-07-11 VITALS — Ht 67.0 in | Wt 217.2 lb

## 2015-07-11 DIAGNOSIS — J449 Chronic obstructive pulmonary disease, unspecified: Secondary | ICD-10-CM | POA: Insufficient documentation

## 2015-07-11 DIAGNOSIS — E119 Type 2 diabetes mellitus without complications: Secondary | ICD-10-CM

## 2015-07-11 NOTE — Progress Notes (Signed)

## 2015-07-15 ENCOUNTER — Encounter: Payer: Self-pay | Admitting: Respiratory Therapy

## 2015-07-15 ENCOUNTER — Encounter: Payer: Self-pay | Admitting: *Deleted

## 2015-07-15 DIAGNOSIS — J449 Chronic obstructive pulmonary disease, unspecified: Secondary | ICD-10-CM | POA: Diagnosis not present

## 2015-07-15 NOTE — Progress Notes (Signed)
Pulmonary Individual Treatment Plan  Patient Details  Name: [name] MRN: [id number] Date of Birth: 10-Nov-1945 Referring Provider:  Dr. Morey Hummingbird  Initial Encounter Date:  04/23/15  Visit Diagnosis: COPD, moderate ([place])  Patient's Home Medications on Admission:  Current outpatient prescriptions:  .  acetaminophen (TYLENOL) 500 MG tablet, Take 2 tablets by mouth every 6 (six) hours as needed., Disp: , Rfl:  .  albuterol (PROAIR HFA) 108 (90 BASE) MCG/ACT inhaler, Inhale 2 puffs into the lungs every 6 (six) hours as needed. , Disp: , Rfl:  .  aspirin (ASPIRIN LOW DOSE) 81 MG tablet, Take 81 mg by mouth daily. , Disp: , Rfl:  .  atorvastatin (LIPITOR) 80 MG tablet, Take 80 mg by mouth daily., Disp: , Rfl:  .  clopidogrel (PLAVIX) 75 MG tablet, TAKE 1 TABLET ONE TIME DAILY, Disp: , Rfl:  .  DULoxetine (CYMBALTA) 60 MG capsule, Take 60 mg by mouth daily. , Disp: , Rfl:  .  Fluticasone Furoate-Vilanterol (BREO ELLIPTA) 100-25 MCG/INH AEPB, Inhale 1 puff into the lungs daily. , Disp: , Rfl:  .  lisinopril (PRINIVIL,ZESTRIL) 5 MG tablet, TAKE 1 TABLET ONE TIME DAILY, Disp: , Rfl:  .  metoprolol (TOPROL-XL) 200 MG 24 hr tablet, Take 200 mg by mouth daily. , Disp: , Rfl:  .  tiotropium (SPIRIVA) 18 MCG inhalation capsule, Place 18 mcg into inhaler and inhale daily. , Disp: , Rfl:  .  torsemide (DEMADEX) 20 MG tablet, TAKE 1 TABLET ONE TIME DAILY, Disp: , Rfl:   Past Medical History: Past Medical History  Diagnosis Date  . COPD (chronic obstructive pulmonary disease) ([place])   . Hyperlipidemia   . Hypertension   . Diabetes ([place])   . Peripheral vascular disease ([place])   . Myocardial infarction ([place])     Tobacco Use: History  Smoking status  . Former Smoker -- 2.00 packs/day for 47 years  . Types: Cigarettes  . Quit date: 08/11/2011  Smokeless tobacco  . Never Used    Labs: Recent Review Flowsheet Data    Labs for ITP Cardiac and Pulmonary Rehab Latest Ref Rng 07/29/2012    Cholestrol 0-200 mg/dL 109   LDLCALC 0-100 mg/dL 57   HDL 40-60 mg/dL 31(L)   Trlycerides 0-200 mg/dL 105         POCT Glucose      05/01/15 1226 05/06/15 1000         POCT Blood Glucose   Pre-Exercise 73 mg/dL       Post-Exercise 134 mg/dL       Pre-Exercise #2  167 mg/dL      Post-Exercise #2  113 mg/dL         ADL UCSD:     ADL UCSD      04/23/15 1115       ADL UCSD   ADL Phase Entry     SOB Score total 78     Rest 0     Walk 3     Stairs 5     Bath 3     Dress 3     Shop 4         Pulmonary Function Assessment:     Pulmonary Function Assessment - 04/23/15 1115    Pulmonary Function Tests   RV% 117 %   DLCO% 42.5 %   Initial Spirometry Results   FVC% 70 %   FEV1% 45 %   FEV1/FVC Ratio 50   Post Bronchodilator Spirometry Results  FVC% 66.8 %   FEV1% 43.3 %   FEV1/FVC Ratio 50   Breath   Bilateral Breath Sounds Decreased;Clear   Shortness of Breath Yes      Exercise Target Goals:    Exercise Program Goal: Individual exercise prescription set with THRR, safety & activity barriers. Participant demonstrates ability to understand and report RPE using BORG scale, to self-measure pulse accurately, and to acknowledge the importance of the exercise prescription.  Exercise Prescription Goal: Starting with aerobic activity 30 plus minutes a day, 3 days per week for initial exercise prescription. Provide home exercise prescription and guidelines that participant acknowledges understanding prior to discharge.  Activity Barriers & Risk Stratification:     Activity Barriers & Risk Stratification - 04/23/15 1115    Activity Barriers & Risk Stratification   Activity Barriers Deconditioning;Muscular Weakness;Shortness of Breath;Assistive Device   Risk Stratification Moderate      6 Minute Walk:     6 Minute Walk      04/23/15 1648       6 Minute Walk   Phase Initial     Distance 460 feet     Walk Time 5.5 minutes     Resting HR 68 bpm      Resting BP 134/82 mmHg     Max Ex. HR 79 bpm     Max Ex. BP 138/70 mmHg     RPE 18     Perceived Dyspnea  8     Symptoms No        Initial Exercise Prescription:     Initial Exercise Prescription - 04/23/15 1600    Date of Initial Exercise Prescription   Date 04/23/15   Treadmill   MPH 1   Grade 0   Minutes 10   Recumbant Bike   Level 2   RPM 40   Watts 20   Minutes 10   NuStep   Level 2   Watts 40   Minutes 10   Arm Ergometer   Level 1   Watts 10   Minutes 10   REL-XR   Level 2   Watts 40   Minutes 10   Prescription Details   Frequency (times per week) 3   Duration Progress to 30 minutes of continuous aerobic without signs/symptoms of physical distress   Intensity   THRR REST +  30   Ratings of Perceived Exertion 11-15   Perceived Dyspnea 2-4   Progression Continue progressive overload as per policy without signs/symptoms or physical distress.   Resistance Training   Training Prescription Yes   Weight 2   Reps 10-15      Exercise Prescription Changes:     Exercise Prescription Changes      05/01/15 1200 05/08/15 1100 05/13/15 1100 05/20/15 1100 06/10/15 1000   Exercise Review   Progression  Yes  Yes No   Response to Exercise   Blood Pressure (Admit) 142/76 mmHg  110/66 mmHg 110/66 mmHg    Blood Pressure (Exercise)   132/72 mmHg 132/72 mmHg    Blood Pressure (Exit) 128/80 mmHg  120/70 mmHg 120/70 mmHg    Heart Rate (Admit) 72 bpm  65 bpm 65 bpm    Heart Rate (Exercise) 75 bpm  105 bpm 105 bpm    Heart Rate (Exit) 72 bpm  89 bpm 89 bpm    Oxygen Saturation (Admit) 98 %  95 % 95 %    Oxygen Saturation (Exercise) 95 %  97 % 97 %    Oxygen  Saturation (Exit) 99 %  98 % 98 %    Rating of Perceived Exertion (Exercise) _0 Perceived Dyspnea (Exercise) 0  2 2    Symptoms No  No No    Comments  Is very compliant with all exercise increases and goals and appreciates the challenges  The treadmill was bothering the patient's knees so she used the AE  instead today.  Patient has not attended since last review, therefore, no changes warranted.   Duration Progress to 30 minutes of continuous aerobic without signs/symptoms of physical distress Progress to 30 minutes of continuous aerobic without signs/symptoms of physical distress Progress to 30 minutes of continuous aerobic without signs/symptoms of physical distress Progress to 30 minutes of continuous aerobic without signs/symptoms of physical distress Progress to 30 minutes of continuous aerobic without signs/symptoms of physical distress   Intensity Rest + 30 Rest + 30 THRR unchanged THRR unchanged THRR unchanged   Progression Continue progressive overload as per policy without signs/symptoms or physical distress. Continue progressive overload as per policy without signs/symptoms or physical distress.      Resistance Training   Training Prescription _1    Weight _2 Reps 10-12 10-12 10-12 10-12 10-12   Interval Training   Interval Training _3    Treadmill   MPH _4 Grade 0 0 0 0 0   Minutes _5 NuStep   Level _6 Watts 20 20 40 40 40   Minutes _7 Arm Ergometer   Level    1 1   Watts    10 10   Minutes    10 10   Recumbant Elliptical   Level 1  BioStep 2  BioStep 2  BioStep 2  BioStep 2  BioStep   Watts _8 Minutes _9 07/09/15 1600           Exercise Review   Progression No       Response to Exercise   Comments Patient has not attended since last review, therefore, no changes warranted.       Duration Progress to 30 minutes of continuous aerobic without signs/symptoms of physical distress       Intensity THRR unchanged       Resistance Training   Training Prescription Yes       Weight 1       Reps 10-12       Interval Training   Interval Training No       Treadmill   MPH 1       Grade 0       Minutes 10       NuStep   Level 2       Watts 40        Minutes 15       Arm Ergometer   Level 1       Watts 10       Minutes 10       Recumbant Elliptical   Level 2  BioStep       Watts 20       Minutes 15          Discharge Exercise Prescription (Final Exercise Prescription Changes):  Exercise Prescription Changes - 07/09/15 1600    Exercise Review   Progression No   Response to Exercise   Comments Patient has not attended since last review, therefore, no changes warranted.   Duration Progress to 30 minutes of continuous aerobic without signs/symptoms of physical distress   Intensity THRR unchanged   Resistance Training   Training Prescription Yes   Weight 1   Reps 10-12   Interval Training   Interval Training No   Treadmill   MPH 1   Grade 0   Minutes 10   NuStep   Level 2   Watts 40   Minutes 15   Arm Ergometer   Level 1   Watts 10   Minutes 10   Recumbant Elliptical   Level 2  BioStep   Watts 20   Minutes 15       Nutrition:  Target Goals: Understanding of nutrition guidelines, daily intake of sodium <1570m, cholesterol <2034m calories 30% from fat and 7% or less from saturated fats, daily to have 5 or more servings of fruits and vegetables.  Biometrics:     Pre Biometrics - 04/23/15 1653    Pre Biometrics   Height _0  (1.702 m)   Waist Circumference 46 inches   Hip Circumference 49.5 inches   Waist to Hip Ratio 0.93 %       Nutrition Therapy Plan and Nutrition Goals:     Nutrition Therapy & Goals - 04/23/15 1115    Nutrition Therapy   Diet Ms KiShekould like to meet with the dietitian; she cooks a healthy meal twice a week and has leftovers; she likes peanut butter sandwiches; very hard to cook for one person; she does dring 8gl of fluid a day      Nutrition Discharge: Rate Your Plate Scores:   Psychosocial: Target Goals: Acknowledge presence or absence of depression, maximize coping skills, provide positive support system. Participant is able to verbalize types and ability to use  techniques and skills needed for reducing stress and depression.  Initial Review & Psychosocial Screening:     Initial Psych Review & Screening - 04/23/15 1115    Initial Review   Current issues with Current Depression   Family Dynamics   Good Support System? Yes   Concerns Recent loss of significant other   Comments Ms KiPiankas still suffering the loss of her husband of 2062years he died about [id number].  She does have good support from her 2 sons from her first marriage and enjoys her grand children.   Barriers   Psychosocial barriers to participate in program The patient should benefit from training in stress management and relaxation.   Screening Interventions   Interventions Program counselor consult;Encouraged to exercise      Quality of Life Scores:     Quality of Life - 04/23/15 1115    Quality of Life Scores   Health/Function Pre 9.03 %   Socioeconomic Pre 15.64 %   Psych/Spiritual Pre 9.25 %   Family Pre 25.13 %   GLOBAL Pre 12.53 %      PHQ-9:     Recent Review Flowsheet Data    Depression screen [facility] 06/04/2015 04/23/2015   Decreased Interest 1 1   Down, Depressed, Hopeless 1 1   PHQ - 2 Score 2 2   Altered sleeping 1 1   Tired, decreased energy 1 1   Change in appetite 1 1   Feeling bad or failure about yourself  1 1   Trouble concentrating 0 1   Moving slowly or fidgety/restless 0 0   Suicidal thoughts 0 0   PHQ-9 Score 6 7   Difficult doing work/chores Somewhat difficult Somewhat difficult      Psychosocial Evaluation and Intervention:     Psychosocial Evaluation - 05/01/15 1051    Psychosocial Evaluation & Interventions   Interventions Stress management education;Relaxation education;Encouraged to exercise with the program and follow exercise prescription   Comments Counselor met with Ms. Tamburro today for initial psychosocial evaluation.  She is a 69 year old with COPD who has a strong support system of two adult sons and a sister who live close  by.  Ms. Raliegh Ip is also part of a local church community.  She reports not sleeping well and minimal appetite.  She has tried several OTC sleep aids without success.  Ms. Raliegh Ip reports losing her spouse to lung cancer 5 years ago and has not slept well since then with maximum of 3-4 hours intermittent sleep per night.  Ms. Raliegh Ip denies a history of depression or anxiety, but does exhibit some symptoms of depression currently.  Her mood was a "5" on a scale of 1-10, and she stated if she "lost 25 pounds and got some sleep" she would feel better.  Ms. Raliegh Ip has goals to lose weight, breathe better and be able to line dance with her sister and her son as her goals for this program.  Counselor encouraged her to participate in the depression education today, as well as speak to her Dr. or pharmacist about a natural OTC sleep aid that is sustained release.  Counselor also provided a local therapist contact information for Ms. Beel and will follow up re: these recommendations.     Continued Psychosocial Services Needed Yes  Follow up on sleep, and recommendations for talk therapy to help with unresolved grief and loss issues.  She will benefit from meeting with the dietician for weight loss goals as well as participating in all of the psychoeducational components here.       Psychosocial Re-Evaluation:  Education: Education Goals: Education classes will be provided on a weekly basis, covering required topics. Participant will state understanding/return demonstration of topics presented.  Learning Barriers/Preferences:     Learning Barriers/Preferences - 04/23/15 1115    Learning Barriers/Preferences   Learning Barriers None   Learning Preferences Group Instruction;Individual Instruction;Pictoral;Skilled Demonstration;Verbal Instruction;Video;Written Material      Education Topics: Initial Evaluation Education: - Verbal, written and demonstration of respiratory meds, RPE/PD scales, oximetry and breathing techniques.  Instruction on use of nebulizers and MDIs: cleaning and proper use, rinsing mouth with steroid doses and importance of monitoring MDI activations.          Pulmonary Rehab from 05/20/2015 in [place]   Date  04/23/15   Educator  LB   Instruction Review Code  2- meets goals/outcomes      General Nutrition Guidelines/Fats and Fiber: -Group instruction provided by verbal, written material, models and posters to present the general guidelines for heart healthy nutrition. Gives an explanation and review of dietary fats and fiber.      Pulmonary Rehab from 05/20/2015 in [place]   Date  05/20/15   Educator  PI   Instruction Review Code  2- meets goals/outcomes      Controlling Sodium/Reading Food Labels: -Group verbal and written material supporting the discussion of sodium use in heart healthy nutrition. Review and  explanation with models, verbal and written materials for utilization of the food label.   Exercise Physiology & Risk Factors: - Group verbal and written instruction with models to review the exercise physiology of the cardiovascular system and associated critical values. Details cardiovascular disease risk factors and the goals associated with each risk factor.   Aerobic Exercise & Resistance Training: - Gives group verbal and written discussion on the health impact of inactivity. On the components of aerobic and resistive training programs and the benefits of this training and how to safely progress through these programs.   Flexibility, Balance, General Exercise Guidelines: - Provides group verbal and written instruction on the benefits of flexibility and balance training programs. Provides general exercise guidelines with specific guidelines to those with heart or lung disease. Demonstration and skill practice provided.   Stress Management: - Provides group verbal and written instruction  about the health risks of elevated stress, cause of high stress, and healthy ways to reduce stress.   Depression: - Provides group verbal and written instruction on the correlation between heart/lung disease and depressed mood, treatment options, and the stigmas associated with seeking treatment.      Pulmonary Rehab from 05/20/2015 in [place]   Date  05/01/15   Educator  [facility]   Instruction Review Code  2- meets goals/outcomes      Exercise & Equipment Safety: - Individual verbal instruction and demonstration of equipment use and safety with use of the equipment.      Pulmonary Rehab from 05/20/2015 in [place]   Date  05/01/15   Educator  LB   Instruction Review Code  2- meets goals/outcomes      Infection Prevention: - Provides verbal and written material to individual with discussion of infection control including proper hand washing and proper equipment cleaning during exercise session.      Pulmonary Rehab from 05/20/2015 in [place]   Date  05/01/15   Educator  LB   Instruction Review Code  2- meets goals/outcomes      Falls Prevention: - Provides verbal and written material to individual with discussion of falls prevention and safety.      Pulmonary Rehab from 05/20/2015 in [place]   Date  04/23/15   Educator  LB   Instruction Review Code  2- meets goals/outcomes      Diabetes: - Individual verbal and written instruction to review signs/symptoms of diabetes, desired ranges of glucose level fasting, after meals and with exercise. Advice that pre and post exercise glucose checks will be done for 3 sessions at entry of program.      Pulmonary Rehab from 05/20/2015 in [place]   Date  05/01/15   Educator  LB   Instruction Review Code  2- meets goals/outcomes       Chronic Lung Diseases: - Group verbal and written instruction to review new updates, new respiratory medications, new advancements in procedures and treatments. Provide informative websites and "800" numbers of self-education.   Lung Procedures: - Group verbal and written instruction to describe testing methods done to diagnose lung disease. Review the outcome of test results. Describe the treatment choices: Pulmonary Function Tests, ABGs and oximetry.   Energy Conservation: - Provide group verbal and written instruction for methods to conserve energy, plan and organize activities. Instruct on pacing techniques, use of adaptive equipment and posture/positioning to relieve shortness of  breath.   Triggers: - Group verbal and written instruction to review types of environmental controls: home humidity, furnaces, filters, dust mite/pet prevention, HEPA vacuums. To discuss weather changes, air quality and the benefits of nasal washing.   Exacerbations: - Group verbal and written instruction to provide: warning signs, infection symptoms, calling MD promptly, preventive modes, and value of vaccinations. Review: effective airway clearance, coughing and/or vibration techniques. Create an Sports administrator.   Oxygen: - Individual and group verbal and written instruction on oxygen therapy. Includes supplement oxygen, available portable oxygen systems, continuous and intermittent flow rates, oxygen safety, concentrators, and Medicare reimbursement for oxygen.      Pulmonary Rehab from 05/20/2015 in [place]   Date  04/23/15   Educator  lb   Instruction Review Code  2- meets goals/outcomes      Respiratory Medications: - Group verbal and written instruction to review medications for lung disease. Drug class, frequency, complications, importance of spacers, rinsing mouth after steroid MDI's, and proper cleaning methods for nebulizers.      Pulmonary Rehab from  05/20/2015 in [place]   Date  04/23/15   Educator  LB   Instruction Review Code  2- meets goals/outcomes      AED/CPR: - Group verbal and written instruction with the use of models to demonstrate the basic use of the AED with the basic ABC's of resuscitation.   Breathing Retraining: - Provides individuals verbal and written instruction on purpose, frequency, and proper technique of diaphragmatic breathing and pursed-lipped breathing. Applies individual practice skills.      Pulmonary Rehab from 05/20/2015 in [place]   Date  04/23/15   Educator  LB   Instruction Review Code  2- meets goals/outcomes      Anatomy and Physiology of the Lungs: - Group verbal and written instruction with the use of models to provide basic lung anatomy and physiology related to function, structure and complications of lung disease.   Heart Failure: - Group verbal and written instruction on the basics of heart failure: signs/symptoms, treatments, explanation of ejection fraction, enlarged heart and cardiomyopathy.   Sleep Apnea: - Individual verbal and written instruction to review Obstructive Sleep Apnea. Review of risk factors, methods for diagnosing and types of masks and machines for OSA.   Anxiety: - Provides group, verbal and written instruction on the correlation between heart/lung disease and anxiety, treatment options, and management of anxiety.   Relaxation: - Provides group, verbal and written instruction about the benefits of relaxation for patients with heart/lung disease. Also provides patients with examples of relaxation techniques.   Knowledge Questionnaire Score:     Knowledge Questionnaire Score - 04/23/15 1115    Knowledge Questionnaire Score   Pre Score -1      Personal Goals and Risk Factors at Admission:     Personal Goals and Risk Factors at Admission - 04/23/15 1115    Personal Goals  and Risk Factors on Admission    Weight Management Yes   Intervention Learn and follow the exercise and diet guidelines while in the program. Utilize the nutrition and education classes to help gain knowledge of the diet and exercise expectations in the program  Ms Lobb would like to meet with the dietitian; she cooks a healthy meal twice a week and has leftovers; she likes peanut butter sandwiches; very hard to cook for one person; she does dring 8gl of fluid a day  Goal Weight 150 lb (68.04 kg)   Increase Aerobic Exercise and Physical Activity Yes   Intervention While in program, learn and follow the exercise prescription taught. Start at a low level workload and increase workload after able to maintain previous level for 30 minutes. Increase time before increasing intensity.  Ms Daywalt has never been so out of shape and is looking forward to a change in her lifestyle with exercise a part of it. Plan to check if she has [employer] research officer, government.   Understand more about Heart/Pulmonary Disease. Yes   Intervention While in program utilize professionals for any questions, and attend the education sessions. Great websites to use are www.americanheart.org or www.lung.org for reliable information.  Ms Crunk is interested in education about COPD and daily management of her disease.   Improve shortness of breath with ADL's Yes   Intervention While in program, learn and follow the exercise prescription taught. Start at a low level workload and increase workload ad advised by the exercise physiologist. Increase time before increasing intensity.  Ms Wendel would like to improve her shortness of breath, especially with housework.   Develop more efficient breathing techniques such as purse lipped breathing and diaphragmatic breathing; and practicing self-pacing with activity Yes   Intervention While in program, learn and utilize the specific breathing techniques taught to you. Continue to practice and use the techniques as  needed.   Increase knowledge of respiratory medications and ability to use respiratory devices properly.  Yes   Intervention While in program learn and demonstrate appropriate use of your oxygen therapy by increasing flow with exertion, manage oxygen tank operation, including continuous and intermittent flow.  Understanding oxygen is a drug ordered by your physician.;While in program, learn to administer MDI, nebulizer, and spacer properly.;Learn to take respiratory medicine as ordered.;While in program, learn to Clean MDI, nebulizers, and spacers properly.  Ms Nay uses [place], Spiriva, and Ventolin for inhalers. I gave her a spacer for her Ventolin MDI   with instructions. She is on 2l/m oxygen from [employer], and her portable system is a concentrator at intermittent flow.   Diabetes Yes   Goal Blood glucose control identified by blood glucose values, HgbA1C. Participant verbalizes understanding of the signs/symptoms of hyper/hypo glycemia, proper foot care and importance of medication and nutrition plan for blood glucose control.   Intervention Provide nutrition & aerobic exercise along with prescribed medications to achieve blood glucose in normal ranges: Fasting 65-99 mg/dL   Hypertension Yes   Goal Participant will see blood pressure controlled within the values of 140/86m/Hg or within value directed by their physician.   Intervention Provide nutrition & aerobic exercise along with prescribed medications to achieve BP 140/90 or less.      Personal Goals and Risk Factors Review:      Goals and Risk Factor Review      05/01/15 1000 05/06/15 1000 05/08/15 1107 05/14/15 0639     Increase Aerobic Exercise and Physical Activity   Goals Progress/Improvement seen   Yes Yes     Comments  Ms KSchlagis working on increasing her exercise goals in [facility] and has increased her exercise time from 455mutes to 5039m. [facility]. Reviewed  individualized exercise prescription and made increases per departmental policy. Exercise increases were discussed with the patient and they were able to perform the new work loads without issue (no signs or symptoms).      Improve shortness of  breath with ADL's   Goals Progress/Improvement seen   Yes      Comments  Ms Utke has good PD scores with her exercise and will encourage her to continue increasing her intensiy with her exercise goals.      Diabetes   Goal --  Ms Chumney has a low glucose before [facility] class. She ate at  4:00am, so I educated her on snacking before class and  a good snack with protein. --  Second reading pre and post exercise: 167 and post 113;  --  Ms Hilario's glucose level was checked 3 sessions before exercise and after exercise. This record she can show Dr Kary Kos and encouraged her to have an A!C drawn.    Hypertension   Goal  --  Maintaining acceptable BP at rest and Exercise in [facility]  --  Acceptable BP with exercise and at rest in Lung       Personal Goals Discharge (Final Personal Goals and Risk Factors Review):      Goals and Risk Factor Review - 05/14/15 0639    Diabetes   Goal --  Ms Milliman's glucose level was checked 3 sessions before exercise and after exercise. This record she can show Dr Kary Kos and encouraged her to have an A!C drawn.   Hypertension   Goal --  Acceptable BP with exercise and at rest in Lung      ITP Comments:     ITP Comments      07/15/15 0905           ITP Comments Called Ms Spada and left a message on 07/10/2015; she has been out since 05/20/2015          Comments: 30 Day Review

## 2015-07-15 NOTE — Progress Notes (Signed)
Pulmonary Individual Treatment Plan  Patient Details  Name: [name] MRN: [id number] Date of Birth: 08/17/1945 Referring Provider:  Dr [place] Pink  Initial Encounter Date: 04/23/2015 Visit Diagnosis: COPD Moderate  Patient's Home Medications on Admission:  Current outpatient prescriptions:    acetaminophen (TYLENOL) 500 MG tablet, Take 2 tablets by mouth every 6 (six) hours as needed., Disp: , Rfl:    albuterol (PROAIR HFA) 108 (90 BASE) MCG/ACT inhaler, Inhale 2 puffs into the lungs every 6 (six) hours as needed. , Disp: , Rfl:    aspirin (ASPIRIN LOW DOSE) 81 MG tablet, Take 81 mg by mouth daily. , Disp: , Rfl:    atorvastatin (LIPITOR) 80 MG tablet, Take 80 mg by mouth daily., Disp: , Rfl:    clopidogrel (PLAVIX) 75 MG tablet, TAKE 1 TABLET ONE TIME DAILY, Disp: , Rfl:    DULoxetine (CYMBALTA) 60 MG capsule, Take 60 mg by mouth daily. , Disp: , Rfl:    Fluticasone Furoate-Vilanterol (BREO ELLIPTA) 100-25 MCG/INH AEPB, Inhale 1 puff into the lungs daily. , Disp: , Rfl:    lisinopril (PRINIVIL,ZESTRIL) 5 MG tablet, TAKE 1 TABLET ONE TIME DAILY, Disp: , Rfl:    metoprolol (TOPROL-XL) 200 MG 24 hr tablet, Take 200 mg by mouth daily. , Disp: , Rfl:    tiotropium (SPIRIVA) 18 MCG inhalation capsule, Place 18 mcg into inhaler and inhale daily. , Disp: , Rfl:    torsemide (DEMADEX) 20 MG tablet, TAKE 1 TABLET ONE TIME DAILY, Disp: , Rfl:   Past Medical History: Past Medical History  Diagnosis Date   COPD (chronic obstructive pulmonary disease) ([place])    Hyperlipidemia    Hypertension    Diabetes ([place])    Peripheral vascular disease ([place])    Myocardial infarction ([place])     Tobacco Use: History  Smoking status   Former Smoker -- 2.00 packs/day for 47 years   Types: Cigarettes   Quit date: 08/11/2011  Smokeless tobacco   Never Used    Labs: Recent Review Flowsheet Data    Labs for ITP Cardiac and Pulmonary Rehab Latest Ref Rng 07/29/2012   Cholestrol  0-200 mg/dL 109   LDLCALC 0-100 mg/dL 57   HDL 40-60 mg/dL 31(L)   Trlycerides 0-200 mg/dL 105         POCT Glucose      05/01/15 1226 05/06/15 1000         POCT Blood Glucose   Pre-Exercise 73 mg/dL       Post-Exercise 134 mg/dL       Pre-Exercise #2  167 mg/dL      Post-Exercise #2  113 mg/dL         ADL UCSD:     ADL UCSD      04/23/15 1115       ADL UCSD   ADL Phase Entry     SOB Score total 78     Rest 0     Walk 3     Stairs 5     Bath 3     Dress 3     Shop 4         Pulmonary Function Assessment:     Pulmonary Function Assessment - 04/23/15 1115    Pulmonary Function Tests   RV% 117 %   DLCO% 42.5 %   Initial Spirometry Results   FVC% 70 %   FEV1% 45 %   FEV1/FVC Ratio 50   Post Bronchodilator Spirometry Results   FVC% 66.8 %  FEV1% 43.3 %   FEV1/FVC Ratio 50   Breath   Bilateral Breath Sounds Decreased;Clear   Shortness of Breath Yes      Exercise Target Goals:    Exercise Program Goal: Individual exercise prescription set with THRR, safety & activity barriers. Participant demonstrates ability to understand and report RPE using BORG scale, to self-measure pulse accurately, and to acknowledge the importance of the exercise prescription.  Exercise Prescription Goal: Starting with aerobic activity 30 plus minutes a day, 3 days per week for initial exercise prescription. Provide home exercise prescription and guidelines that participant acknowledges understanding prior to discharge.  Activity Barriers & Risk Stratification:     Activity Barriers & Risk Stratification - 04/23/15 1115    Activity Barriers & Risk Stratification   Activity Barriers Deconditioning;Muscular Weakness;Shortness of Breath;Assistive Device   Risk Stratification Moderate      6 Minute Walk:     6 Minute Walk      04/23/15 1648       6 Minute Walk   Phase Initial     Distance 460 feet     Walk Time 5.5 minutes     Resting HR 68 bpm     Resting BP  134/82 mmHg     Max Ex. HR 79 bpm     Max Ex. BP 138/70 mmHg     RPE 18     Perceived Dyspnea  8     Symptoms No        Initial Exercise Prescription:     Initial Exercise Prescription - 04/23/15 1600    Date of Initial Exercise Prescription   Date 04/23/15   Treadmill   MPH 1   Grade 0   Minutes 10   Recumbant Bike   Level 2   RPM 40   Watts 20   Minutes 10   NuStep   Level 2   Watts 40   Minutes 10   Arm Ergometer   Level 1   Watts 10   Minutes 10   REL-XR   Level 2   Watts 40   Minutes 10   Prescription Details   Frequency (times per week) 3   Duration Progress to 30 minutes of continuous aerobic without signs/symptoms of physical distress   Intensity   THRR REST +  30   Ratings of Perceived Exertion 11-15   Perceived Dyspnea 2-4   Progression Continue progressive overload as per policy without signs/symptoms or physical distress.   Resistance Training   Training Prescription Yes   Weight 2   Reps 10-15      Exercise Prescription Changes:     Exercise Prescription Changes      05/01/15 1200 05/08/15 1100 05/13/15 1100 05/20/15 1100 06/10/15 1000   Exercise Review   Progression  Yes  Yes No   Response to Exercise   Blood Pressure (Admit) 142/76 mmHg  110/66 mmHg 110/66 mmHg    Blood Pressure (Exercise)   132/72 mmHg 132/72 mmHg    Blood Pressure (Exit) 128/80 mmHg  120/70 mmHg 120/70 mmHg    Heart Rate (Admit) 72 bpm  65 bpm 65 bpm    Heart Rate (Exercise) 75 bpm  105 bpm 105 bpm    Heart Rate (Exit) 72 bpm  89 bpm 89 bpm    Oxygen Saturation (Admit) 98 %  95 % 95 %    Oxygen Saturation (Exercise) 95 %  97 % 97 %    Oxygen Saturation (Exit) 99 %  98 % 98 %    Rating of Perceived Exertion (Exercise) _0 Perceived Dyspnea (Exercise) 0  2 2    Symptoms No  No No    Comments  Is very compliant with all exercise increases and goals and appreciates the challenges  The treadmill was bothering the patient's knees so she used the AE instead  today.  Patient has not attended since last review, therefore, no changes warranted.   Duration Progress to 30 minutes of continuous aerobic without signs/symptoms of physical distress Progress to 30 minutes of continuous aerobic without signs/symptoms of physical distress Progress to 30 minutes of continuous aerobic without signs/symptoms of physical distress Progress to 30 minutes of continuous aerobic without signs/symptoms of physical distress Progress to 30 minutes of continuous aerobic without signs/symptoms of physical distress   Intensity Rest + 30 Rest + 30 THRR unchanged THRR unchanged THRR unchanged   Progression Continue progressive overload as per policy without signs/symptoms or physical distress. Continue progressive overload as per policy without signs/symptoms or physical distress.      Resistance Training   Training Prescription _1    Weight _2 Reps 10-12 10-12 10-12 10-12 10-12   Interval Training   Interval Training _3    Treadmill   MPH _4 Grade 0 0 0 0 0   Minutes _5 NuStep   Level _6 Watts 20 20 40 40 40   Minutes _7 Arm Ergometer   Level    1 1   Watts    10 10   Minutes    10 10   Recumbant Elliptical   Level 1  BioStep 2  BioStep 2  BioStep 2  BioStep 2  BioStep   Watts _8 Minutes _9 07/09/15 1600           Exercise Review   Progression No       Response to Exercise   Comments Patient has not attended since last review, therefore, no changes warranted.       Duration Progress to 30 minutes of continuous aerobic without signs/symptoms of physical distress       Intensity THRR unchanged       Resistance Training   Training Prescription Yes       Weight 1       Reps 10-12       Interval Training   Interval Training No       Treadmill   MPH 1       Grade 0       Minutes 10       NuStep   Level 2       Watts 40       Minutes 15        Arm Ergometer   Level 1       Watts 10       Minutes 10       Recumbant Elliptical   Level 2  BioStep       Watts 20       Minutes 15          Discharge Exercise Prescription (Final Exercise Prescription Changes):     Exercise Prescription Changes -  07/09/15 1600    Exercise Review   Progression No   Response to Exercise   Comments Patient has not attended since last review, therefore, no changes warranted.   Duration Progress to 30 minutes of continuous aerobic without signs/symptoms of physical distress   Intensity THRR unchanged   Resistance Training   Training Prescription Yes   Weight 1   Reps 10-12   Interval Training   Interval Training No   Treadmill   MPH 1   Grade 0   Minutes 10   NuStep   Level 2   Watts 40   Minutes 15   Arm Ergometer   Level 1   Watts 10   Minutes 10   Recumbant Elliptical   Level 2  BioStep   Watts 20   Minutes 15       Nutrition:  Target Goals: Understanding of nutrition guidelines, daily intake of sodium '1500mg'$ , cholesterol '200mg'$ , calories 30% from fat and 7% or less from saturated fats, daily to have 5 or more servings of fruits and vegetables.  Biometrics:     Pre Biometrics - 04/23/15 1653    Pre Biometrics   Height $Remov'5\' 7"'nBbYrc$  (1.702 m)   Waist Circumference 46 inches   Hip Circumference 49.5 inches   Waist to Hip Ratio 0.93 %       Nutrition Therapy Plan and Nutrition Goals:     Nutrition Therapy & Goals - 04/23/15 1115    Nutrition Therapy   Diet Ms Weimer would like to meet with the dietitian; she cooks a healthy meal twice a week and has leftovers; she likes peanut butter sandwiches; very hard to cook for one person; she does dring 8gl of fluid a day      Nutrition Discharge: Rate Your Plate Scores:   Psychosocial: Target Goals: Acknowledge presence or absence of depression, maximize coping skills, provide positive support system. Participant is able to verbalize types and ability to use techniques and  skills needed for reducing stress and depression.  Initial Review & Psychosocial Screening:     Initial Psych Review & Screening - 04/23/15 1115    Initial Review   Current issues with Current Depression   Family Dynamics   Good Support System? Yes   Concerns Recent loss of significant other   Comments Ms Friedli is still suffering the loss of her husband of 37years - he died about 37yrs ago.  She does have good support from her 2 sons from her first marriage and enjoys her grand children.   Barriers   Psychosocial barriers to participate in program The patient should benefit from training in stress management and relaxation.   Screening Interventions   Interventions Program counselor consult;Encouraged to exercise      Quality of Life Scores:     Quality of Life - 04/23/15 1115    Quality of Life Scores   Health/Function Pre 9.03 %   Socioeconomic Pre 15.64 %   Psych/Spiritual Pre 9.25 %   Family Pre 25.13 %   GLOBAL Pre 12.53 %      PHQ-9:     Recent Review Flowsheet Data    Depression screen [facility] 2/9 06/04/2015 04/23/2015   Decreased Interest 1 1   Down, Depressed, Hopeless 1 1   PHQ - 2 Score 2 2   Altered sleeping 1 1   Tired, decreased energy 1 1   Change in appetite 1 1   Feeling bad or failure about yourself  1 1  Trouble concentrating 0 1   Moving slowly or fidgety/restless 0 0   Suicidal thoughts 0 0   PHQ-9 Score 6 7   Difficult doing work/chores Somewhat difficult Somewhat difficult      Psychosocial Evaluation and Intervention:     Psychosocial Evaluation - 05/01/15 1051    Psychosocial Evaluation & Interventions   Interventions Stress management education;Relaxation education;Encouraged to exercise with the program and follow exercise prescription   Comments Counselor met with Ms. Askari today for initial psychosocial evaluation.  She is a 69 year old with COPD who has a strong support system of two adult sons and a sister who live close by.  Ms. Raliegh Ip is  also part of a local church community.  She reports not sleeping well and minimal appetite.  She has tried several OTC sleep aids without success.  Ms. Raliegh Ip reports losing her spouse to lung cancer 5 years ago and has not slept well since then with maximum of 3-4 hours intermittent sleep per night.  Ms. Raliegh Ip denies a history of depression or anxiety, but does exhibit some symptoms of depression currently.  Her mood was a "5" on a scale of 1-10, and she stated if she "lost 25 pounds and got some sleep" she would feel better.  Ms. Raliegh Ip has goals to lose weight, breathe better and be able to line dance with her sister and her son as her goals for this program.  Counselor encouraged her to participate in the depression education today, as well as speak to her Dr. or pharmacist about a natural OTC sleep aid that is sustained release.  Counselor also provided a local therapist contact information for Ms. Taitt and will follow up re: these recommendations.     Continued Psychosocial Services Needed Yes  Follow up on sleep, and recommendations for talk therapy to help with unresolved grief and loss issues.  She will benefit from meeting with the dietician for weight loss goals as well as participating in all of the psychoeducational components here.       Psychosocial Re-Evaluation:  Education: Education Goals: Education classes will be provided on a weekly basis, covering required topics. Participant will state understanding/return demonstration of topics presented.  Learning Barriers/Preferences:     Learning Barriers/Preferences - 04/23/15 1115    Learning Barriers/Preferences   Learning Barriers None   Learning Preferences Group Instruction;Individual Instruction;Pictoral;Skilled Demonstration;Verbal Instruction;Video;Written Material      Education Topics: Initial Evaluation Education: - Verbal, written and demonstration of respiratory meds, RPE/PD scales, oximetry and breathing techniques. Instruction on  use of nebulizers and MDIs: cleaning and proper use, rinsing mouth with steroid doses and importance of monitoring MDI activations.          Pulmonary Rehab from 05/20/2015 in [place]   Date  04/23/15   Educator  LB   Instruction Review Code  2- meets goals/outcomes      General Nutrition Guidelines/Fats and Fiber: -Group instruction provided by verbal, written material, models and posters to present the general guidelines for heart healthy nutrition. Gives an explanation and review of dietary fats and fiber.      Pulmonary Rehab from 05/20/2015 in [place]   Date  05/20/15   Educator  PI   Instruction Review Code  2- meets goals/outcomes      Controlling Sodium/Reading Food Labels: -Group verbal and written material supporting the discussion of sodium use in heart healthy nutrition. Review and explanation with models, verbal  and written materials for utilization of the food label.   Exercise Physiology & Risk Factors: - Group verbal and written instruction with models to review the exercise physiology of the cardiovascular system and associated critical values. Details cardiovascular disease risk factors and the goals associated with each risk factor.   Aerobic Exercise & Resistance Training: - Gives group verbal and written discussion on the health impact of inactivity. On the components of aerobic and resistive training programs and the benefits of this training and how to safely progress through these programs.   Flexibility, Balance, General Exercise Guidelines: - Provides group verbal and written instruction on the benefits of flexibility and balance training programs. Provides general exercise guidelines with specific guidelines to those with heart or lung disease. Demonstration and skill practice provided.   Stress Management: - Provides group verbal and written instruction about the health  risks of elevated stress, cause of high stress, and healthy ways to reduce stress.   Depression: - Provides group verbal and written instruction on the correlation between heart/lung disease and depressed mood, treatment options, and the stigmas associated with seeking treatment.      Pulmonary Rehab from 05/20/2015 in [place]   Date  05/01/15   Educator  [facility]   Instruction Review Code  2- meets goals/outcomes      Exercise & Equipment Safety: - Individual verbal instruction and demonstration of equipment use and safety with use of the equipment.      Pulmonary Rehab from 05/20/2015 in [place]   Date  05/01/15   Educator  LB   Instruction Review Code  2- meets goals/outcomes      Infection Prevention: - Provides verbal and written material to individual with discussion of infection control including proper hand washing and proper equipment cleaning during exercise session.      Pulmonary Rehab from 05/20/2015 in [place]   Date  05/01/15   Educator  LB   Instruction Review Code  2- meets goals/outcomes      Falls Prevention: - Provides verbal and written material to individual with discussion of falls prevention and safety.      Pulmonary Rehab from 05/20/2015 in [place]   Date  04/23/15   Educator  LB   Instruction Review Code  2- meets goals/outcomes      Diabetes: - Individual verbal and written instruction to review signs/symptoms of diabetes, desired ranges of glucose level fasting, after meals and with exercise. Advice that pre and post exercise glucose checks will be done for 3 sessions at entry of program.      Pulmonary Rehab from 05/20/2015 in [place]   Date  05/01/15   Educator  LB   Instruction Review Code  2- meets goals/outcomes      Chronic Lung  Diseases: - Group verbal and written instruction to review new updates, new respiratory medications, new advancements in procedures and treatments. Provide informative websites and "800" numbers of self-education.   Lung Procedures: - Group verbal and written instruction to describe testing methods done to diagnose lung disease. Review the outcome of test results. Describe the treatment choices: Pulmonary Function Tests, ABGs and oximetry.   Energy Conservation: - Provide group verbal and written instruction for methods to conserve energy, plan and organize activities. Instruct on pacing techniques, use of adaptive equipment and posture/positioning to relieve shortness of breath.   Triggers: -  Group verbal and written instruction to review types of environmental controls: home humidity, furnaces, filters, dust mite/pet prevention, HEPA vacuums. To discuss weather changes, air quality and the benefits of nasal washing.   Exacerbations: - Group verbal and written instruction to provide: warning signs, infection symptoms, calling MD promptly, preventive modes, and value of vaccinations. Review: effective airway clearance, coughing and/or vibration techniques. Create an Sports administrator.   Oxygen: - Individual and group verbal and written instruction on oxygen therapy. Includes supplement oxygen, available portable oxygen systems, continuous and intermittent flow rates, oxygen safety, concentrators, and Medicare reimbursement for oxygen.      Pulmonary Rehab from 05/20/2015 in [place]   Date  04/23/15   Educator  lb   Instruction Review Code  2- meets goals/outcomes      Respiratory Medications: - Group verbal and written instruction to review medications for lung disease. Drug class, frequency, complications, importance of spacers, rinsing mouth after steroid MDI's, and proper cleaning methods for nebulizers.      Pulmonary Rehab from 05/20/2015 in  [place]   Date  04/23/15   Educator  LB   Instruction Review Code  2- meets goals/outcomes      AED/CPR: - Group verbal and written instruction with the use of models to demonstrate the basic use of the AED with the basic ABC's of resuscitation.   Breathing Retraining: - Provides individuals verbal and written instruction on purpose, frequency, and proper technique of diaphragmatic breathing and pursed-lipped breathing. Applies individual practice skills.      Pulmonary Rehab from 05/20/2015 in [place]   Date  04/23/15   Educator  LB   Instruction Review Code  2- meets goals/outcomes      Anatomy and Physiology of the Lungs: - Group verbal and written instruction with the use of models to provide basic lung anatomy and physiology related to function, structure and complications of lung disease.   Heart Failure: - Group verbal and written instruction on the basics of heart failure: signs/symptoms, treatments, explanation of ejection fraction, enlarged heart and cardiomyopathy.   Sleep Apnea: - Individual verbal and written instruction to review Obstructive Sleep Apnea. Review of risk factors, methods for diagnosing and types of masks and machines for OSA.   Anxiety: - Provides group, verbal and written instruction on the correlation between heart/lung disease and anxiety, treatment options, and management of anxiety.   Relaxation: - Provides group, verbal and written instruction about the benefits of relaxation for patients with heart/lung disease. Also provides patients with examples of relaxation techniques.   Knowledge Questionnaire Score:     Knowledge Questionnaire Score - 04/23/15 1115    Knowledge Questionnaire Score   Pre Score -1      Personal Goals and Risk Factors at Admission:     Personal Goals and Risk Factors at Admission - 04/23/15 1115    Personal Goals and Risk  Factors on Admission    Weight Management Yes   Intervention Learn and follow the exercise and diet guidelines while in the program. Utilize the nutrition and education classes to help gain knowledge of the diet and exercise expectations in the program  Ms Walthers would like to meet with the dietitian; she cooks a healthy meal twice a week and has leftovers; she likes peanut butter sandwiches; very hard to cook for one person; she does dring 8gl of fluid a day   Goal Weight 150 lb (68.04  kg)   Increase Aerobic Exercise and Physical Activity Yes   Intervention While in program, learn and follow the exercise prescription taught. Start at a low level workload and increase workload after able to maintain previous level for 30 minutes. Increase time before increasing intensity.  Ms Jerrell has never been so out of shape and is looking forward to a change in her lifestyle with exercise a part of it. Plan to check if she has [employer] research officer, government.   Understand more about Heart/Pulmonary Disease. Yes   Intervention While in program utilize professionals for any questions, and attend the education sessions. Great websites to use are www.americanheart.org or www.lung.org for reliable information.  Ms Dilley is interested in education about COPD and daily management of her disease.   Improve shortness of breath with ADL's Yes   Intervention While in program, learn and follow the exercise prescription taught. Start at a low level workload and increase workload ad advised by the exercise physiologist. Increase time before increasing intensity.  Ms Swartzentruber would like to improve her shortness of breath, especially with housework.   Develop more efficient breathing techniques such as purse lipped breathing and diaphragmatic breathing; and practicing self-pacing with activity Yes   Intervention While in program, learn and utilize the specific breathing techniques taught to you. Continue to practice and use the techniques as needed.    Increase knowledge of respiratory medications and ability to use respiratory devices properly.  Yes   Intervention While in program learn and demonstrate appropriate use of your oxygen therapy by increasing flow with exertion, manage oxygen tank operation, including continuous and intermittent flow.  Understanding oxygen is a drug ordered by your physician.;While in program, learn to administer MDI, nebulizer, and spacer properly.;Learn to take respiratory medicine as ordered.;While in program, learn to Clean MDI, nebulizers, and spacers properly.  Ms Polakowski uses [place], Spiriva, and Ventolin for inhalers. I gave her a spacer for her Ventolin MDI   with instructions. She is on 2l/m oxygen from [employer], and her portable system is a concentrator at intermittent flow.   Diabetes Yes   Goal Blood glucose control identified by blood glucose values, HgbA1C. Participant verbalizes understanding of the signs/symptoms of hyper/hypo glycemia, proper foot care and importance of medication and nutrition plan for blood glucose control.   Intervention Provide nutrition & aerobic exercise along with prescribed medications to achieve blood glucose in normal ranges: Fasting 65-99 mg/dL   Hypertension Yes   Goal Participant will see blood pressure controlled within the values of 140/35m/Hg or within value directed by their physician.   Intervention Provide nutrition & aerobic exercise along with prescribed medications to achieve BP 140/90 or less.      Personal Goals and Risk Factors Review:      Goals and Risk Factor Review      05/01/15 1000 05/06/15 1000 05/08/15 1107 05/14/15 0639     Increase Aerobic Exercise and Physical Activity   Goals Progress/Improvement seen   Yes Yes     Comments  Ms KTernesis working on increasing her exercise goals in [facility] and has increased her exercise time from 423mutes to 5045m. [facility]. Reviewed individualized  exercise prescription and made increases per departmental policy. Exercise increases were discussed with the patient and they were able to perform the new work loads without issue (no signs or symptoms).      Improve shortness of breath with ADL's  Goals Progress/Improvement seen   Yes      Comments  Ms Salvador has good PD scores with her exercise and will encourage her to continue increasing her intensiy with her exercise goals.      Diabetes   Goal --  Ms Peyser has a low glucose before [facility] class. She ate at  4:00am, so I educated her on snacking before class and  a good snack with protein. --  Second reading pre and post exercise: 167 and post 113;  --  Ms Jedlicka's glucose level was checked 3 sessions before exercise and after exercise. This record she can show Dr Kary Kos and encouraged her to have an A!C drawn.    Hypertension   Goal  --  Maintaining acceptable BP at rest and Exercise in [facility]  --  Acceptable BP with exercise and at rest in Lung       Personal Goals Discharge (Final Personal Goals and Risk Factors Review):      Goals and Risk Factor Review - 05/14/15 0639    Diabetes   Goal --  Ms Carnevale's glucose level was checked 3 sessions before exercise and after exercise. This record she can show Dr Kary Kos and encouraged her to have an A!C drawn.   Hypertension   Goal --  Acceptable BP with exercise and at rest in Lung      ITP Comments:     ITP Comments      07/15/15 0905           ITP Comments Called Ms Bangert and left a message on 07/10/2015; she has been out since 05/20/2015          Comments: 30 day note review

## 2015-07-18 ENCOUNTER — Telehealth: Payer: Self-pay | Admitting: *Deleted

## 2015-07-18 NOTE — Telephone Encounter (Signed)
Received phone call from patient. She reports that she is not feeling well and will need to cancel Classes 2 and 3. Pt reports she hasn't felt well for months. She has seen MD and no changes have been made. Pt reports dealing with her COPD and diabetes is a lot to manage right now. Instructed her to call when she is able to return and made her aware that referral is good for a year.

## 2015-07-28 DIAGNOSIS — J449 Chronic obstructive pulmonary disease, unspecified: Secondary | ICD-10-CM | POA: Diagnosis not present

## 2015-07-29 ENCOUNTER — Telehealth: Payer: Self-pay

## 2015-07-29 NOTE — Telephone Encounter (Signed)
Left message for Taylor DavenportSandra. Last attended 05/20/15

## 2015-08-01 ENCOUNTER — Encounter: Payer: Self-pay | Admitting: *Deleted

## 2015-08-06 ENCOUNTER — Encounter: Payer: Self-pay | Admitting: Respiratory Therapy

## 2015-08-06 DIAGNOSIS — J449 Chronic obstructive pulmonary disease, unspecified: Secondary | ICD-10-CM

## 2015-08-06 NOTE — Progress Notes (Signed)
Pulmonary Individual Treatment Plan  Patient Details  Name: [name] MRN: [id number] Date of Birth: 1945-10-19 Referring Provider:  Dr Morey Hummingbird  Initial Encounter Date: 04/23/2015  Visit Diagnosis: COPD, moderate ([place])  Patient's Home Medications on Admission:  Current outpatient prescriptions:    acetaminophen (TYLENOL) 500 MG tablet, Take 2 tablets by mouth every 6 (six) hours as needed., Disp: , Rfl:    albuterol (PROAIR HFA) 108 (90 BASE) MCG/ACT inhaler, Inhale 2 puffs into the lungs every 6 (six) hours as needed. , Disp: , Rfl:    aspirin (ASPIRIN LOW DOSE) 81 MG tablet, Take 81 mg by mouth daily. , Disp: , Rfl:    atorvastatin (LIPITOR) 80 MG tablet, Take 80 mg by mouth daily., Disp: , Rfl:    clopidogrel (PLAVIX) 75 MG tablet, TAKE 1 TABLET ONE TIME DAILY, Disp: , Rfl:    DULoxetine (CYMBALTA) 60 MG capsule, Take 60 mg by mouth daily. , Disp: , Rfl:    Fluticasone Furoate-Vilanterol (BREO ELLIPTA) 100-25 MCG/INH AEPB, Inhale 1 puff into the lungs daily. , Disp: , Rfl:    lisinopril (PRINIVIL,ZESTRIL) 5 MG tablet, TAKE 1 TABLET ONE TIME DAILY, Disp: , Rfl:    metoprolol (TOPROL-XL) 200 MG 24 hr tablet, Take 200 mg by mouth daily. , Disp: , Rfl:    tiotropium (SPIRIVA) 18 MCG inhalation capsule, Place 18 mcg into inhaler and inhale daily. , Disp: , Rfl:    torsemide (DEMADEX) 20 MG tablet, TAKE 1 TABLET ONE TIME DAILY, Disp: , Rfl:   Past Medical History: Past Medical History  Diagnosis Date   COPD (chronic obstructive pulmonary disease) ([place])    Hyperlipidemia    Hypertension    Diabetes ([place])    Peripheral vascular disease ([place])    Myocardial infarction ([place])     Tobacco Use: History  Smoking status   Former Smoker -- 2.00 packs/day for 47 years   Types: Cigarettes   Quit date: 08/11/2011  Smokeless tobacco   Never Used    Labs: Recent Review Flowsheet Data    Labs for ITP Cardiac and Pulmonary Rehab Latest Ref Rng 07/29/2012    Cholestrol 0-200 mg/dL 109   LDLCALC 0-100 mg/dL 57   HDL 40-60 mg/dL 31(L)   Trlycerides 0-200 mg/dL 105         POCT Glucose      05/01/15 1226 05/06/15 1000         POCT Blood Glucose   Pre-Exercise 73 mg/dL       Post-Exercise 134 mg/dL       Pre-Exercise #2  167 mg/dL      Post-Exercise #2  113 mg/dL         ADL UCSD:     ADL UCSD      04/23/15 1115       ADL UCSD   ADL Phase Entry     SOB Score total 78     Rest 0     Walk 3     Stairs 5     Bath 3     Dress 3     Shop 4         Pulmonary Function Assessment:     Pulmonary Function Assessment - 04/23/15 1115    Pulmonary Function Tests   RV% 117 %   DLCO% 42.5 %   Initial Spirometry Results   FVC% 70 %   FEV1% 45 %   FEV1/FVC Ratio 50   Post Bronchodilator Spirometry Results  FVC% 66.8 %   FEV1% 43.3 %   FEV1/FVC Ratio 50   Breath   Bilateral Breath Sounds Decreased;Clear   Shortness of Breath Yes      Exercise Target Goals:    Exercise Program Goal: Individual exercise prescription set with THRR, safety & activity barriers. Participant demonstrates ability to understand and report RPE using BORG scale, to self-measure pulse accurately, and to acknowledge the importance of the exercise prescription.  Exercise Prescription Goal: Starting with aerobic activity 30 plus minutes a day, 3 days per week for initial exercise prescription. Provide home exercise prescription and guidelines that participant acknowledges understanding prior to discharge.  Activity Barriers & Risk Stratification:     Activity Barriers & Risk Stratification - 04/23/15 1115    Activity Barriers & Risk Stratification   Activity Barriers Deconditioning;Muscular Weakness;Shortness of Breath;Assistive Device   Risk Stratification Moderate      6 Minute Walk:     6 Minute Walk      04/23/15 1648       6 Minute Walk   Phase Initial     Distance 460 feet     Walk Time 5.5 minutes     Resting HR 68 bpm      Resting BP 134/82 mmHg     Max Ex. HR 79 bpm     Max Ex. BP 138/70 mmHg     RPE 18     Perceived Dyspnea  8     Symptoms No        Initial Exercise Prescription:     Initial Exercise Prescription - 04/23/15 1600    Date of Initial Exercise Prescription   Date 04/23/15   Treadmill   MPH 1   Grade 0   Minutes 10   Recumbant Bike   Level 2   RPM 40   Watts 20   Minutes 10   NuStep   Level 2   Watts 40   Minutes 10   Arm Ergometer   Level 1   Watts 10   Minutes 10   REL-XR   Level 2   Watts 40   Minutes 10   Prescription Details   Frequency (times per week) 3   Duration Progress to 30 minutes of continuous aerobic without signs/symptoms of physical distress   Intensity   THRR REST +  30   Ratings of Perceived Exertion 11-15   Perceived Dyspnea 2-4   Progression Continue progressive overload as per policy without signs/symptoms or physical distress.   Resistance Training   Training Prescription Yes   Weight 2   Reps 10-15      Exercise Prescription Changes:     Exercise Prescription Changes      05/01/15 1200 05/08/15 1100 05/13/15 1100 05/20/15 1100 06/10/15 1000   Exercise Review   Progression  Yes  Yes No   Response to Exercise   Blood Pressure (Admit) 142/76 mmHg  110/66 mmHg 110/66 mmHg    Blood Pressure (Exercise)   132/72 mmHg 132/72 mmHg    Blood Pressure (Exit) 128/80 mmHg  120/70 mmHg 120/70 mmHg    Heart Rate (Admit) 72 bpm  65 bpm 65 bpm    Heart Rate (Exercise) 75 bpm  105 bpm 105 bpm    Heart Rate (Exit) 72 bpm  89 bpm 89 bpm    Oxygen Saturation (Admit) 98 %  95 % 95 %    Oxygen Saturation (Exercise) 95 %  97 % 97 %    Oxygen  Saturation (Exit) 99 %  98 % 98 %    Rating of Perceived Exertion (Exercise) _0 Perceived Dyspnea (Exercise) 0  2 2    Symptoms No  No No    Comments  Is very compliant with all exercise increases and goals and appreciates the challenges  The treadmill was bothering the patient's knees so she used the AE  instead today.  Patient has not attended since last review, therefore, no changes warranted.   Duration Progress to 30 minutes of continuous aerobic without signs/symptoms of physical distress Progress to 30 minutes of continuous aerobic without signs/symptoms of physical distress Progress to 30 minutes of continuous aerobic without signs/symptoms of physical distress Progress to 30 minutes of continuous aerobic without signs/symptoms of physical distress Progress to 30 minutes of continuous aerobic without signs/symptoms of physical distress   Intensity Rest + 30 Rest + 30 THRR unchanged THRR unchanged THRR unchanged   Progression Continue progressive overload as per policy without signs/symptoms or physical distress. Continue progressive overload as per policy without signs/symptoms or physical distress.      Resistance Training   Training Prescription _1    Weight _2 Reps 10-12 10-12 10-12 10-12 10-12   Interval Training   Interval Training _3    Treadmill   MPH _4 Grade 0 0 0 0 0   Minutes _5 NuStep   Level _6 Watts 20 20 40 40 40   Minutes _7 Arm Ergometer   Level    1 1   Watts    10 10   Minutes    10 10   Recumbant Elliptical   Level 1  BioStep 2  BioStep 2  BioStep 2  BioStep 2  BioStep   Watts _8 Minutes _9 07/09/15 1600           Exercise Review   Progression No       Response to Exercise   Comments Patient has not attended since last review, therefore, no changes warranted.       Duration Progress to 30 minutes of continuous aerobic without signs/symptoms of physical distress       Intensity THRR unchanged       Resistance Training   Training Prescription Yes       Weight 1       Reps 10-12       Interval Training   Interval Training No       Treadmill   MPH 1       Grade 0       Minutes 10       NuStep   Level 2       Watts 40        Minutes 15       Arm Ergometer   Level 1       Watts 10       Minutes 10       Recumbant Elliptical   Level 2  BioStep       Watts 20       Minutes 15          Discharge Exercise Prescription (Final Exercise Prescription Changes):  Exercise Prescription Changes - 07/09/15 1600    Exercise Review   Progression No   Response to Exercise   Comments Patient has not attended since last review, therefore, no changes warranted.   Duration Progress to 30 minutes of continuous aerobic without signs/symptoms of physical distress   Intensity THRR unchanged   Resistance Training   Training Prescription Yes   Weight 1   Reps 10-12   Interval Training   Interval Training No   Treadmill   MPH 1   Grade 0   Minutes 10   NuStep   Level 2   Watts 40   Minutes 15   Arm Ergometer   Level 1   Watts 10   Minutes 10   Recumbant Elliptical   Level 2  BioStep   Watts 20   Minutes 15       Nutrition:  Target Goals: Understanding of nutrition guidelines, daily intake of sodium <1532m, cholesterol <2090m calories 30% from fat and 7% or less from saturated fats, daily to have 5 or more servings of fruits and vegetables.  Biometrics:     Pre Biometrics - 04/23/15 1653    Pre Biometrics   Height _0  (1.702 m)   Waist Circumference 46 inches   Hip Circumference 49.5 inches   Waist to Hip Ratio 0.93 %       Nutrition Therapy Plan and Nutrition Goals:     Nutrition Therapy & Goals - 04/23/15 1115    Nutrition Therapy   Diet Ms KiWyssould like to meet with the dietitian; she cooks a healthy meal twice a week and has leftovers; she likes peanut butter sandwiches; very hard to cook for one person; she does dring 8gl of fluid a day      Nutrition Discharge: Rate Your Plate Scores:   Psychosocial: Target Goals: Acknowledge presence or absence of depression, maximize coping skills, provide positive support system. Participant is able to verbalize types and ability to use  techniques and skills needed for reducing stress and depression.  Initial Review & Psychosocial Screening:     Initial Psych Review & Screening - 04/23/15 1115    Initial Review   Current issues with Current Depression   Family Dynamics   Good Support System? Yes   Concerns Recent loss of significant other   Comments Ms KiLeebs still suffering the loss of her husband of 2021years he died about [id number].  She does have good support from her 2 sons from her first marriage and enjoys her grand children.   Barriers   Psychosocial barriers to participate in program The patient should benefit from training in stress management and relaxation.   Screening Interventions   Interventions Program counselor consult;Encouraged to exercise      Quality of Life Scores:     Quality of Life - 04/23/15 1115    Quality of Life Scores   Health/Function Pre 9.03 %   Socioeconomic Pre 15.64 %   Psych/Spiritual Pre 9.25 %   Family Pre 25.13 %   GLOBAL Pre 12.53 %      PHQ-9:     Recent Review Flowsheet Data    Depression screen [facility] 06/04/2015 04/23/2015   Decreased Interest 1 1   Down, Depressed, Hopeless 1 1   PHQ - 2 Score 2 2   Altered sleeping 1 1   Tired, decreased energy 1 1   Change in appetite 1 1   Feeling bad or failure about yourself  1 1   Trouble concentrating 0 1   Moving slowly or fidgety/restless 0 0   Suicidal thoughts 0 0   PHQ-9 Score 6 7   Difficult doing work/chores Somewhat difficult Somewhat difficult      Psychosocial Evaluation and Intervention:     Psychosocial Evaluation - 05/01/15 1051    Psychosocial Evaluation & Interventions   Interventions Stress management education;Relaxation education;Encouraged to exercise with the program and follow exercise prescription   Comments Counselor met with Ms. Probus today for initial psychosocial evaluation.  She is a 69 year old with COPD who has a strong support system of two adult sons and a sister who live close  by.  Ms. Raliegh Ip is also part of a local church community.  She reports not sleeping well and minimal appetite.  She has tried several OTC sleep aids without success.  Ms. Raliegh Ip reports losing her spouse to lung cancer 5 years ago and has not slept well since then with maximum of 3-4 hours intermittent sleep per night.  Ms. Raliegh Ip denies a history of depression or anxiety, but does exhibit some symptoms of depression currently.  Her mood was a "5" on a scale of 1-10, and she stated if she "lost 25 pounds and got some sleep" she would feel better.  Ms. Raliegh Ip has goals to lose weight, breathe better and be able to line dance with her sister and her son as her goals for this program.  Counselor encouraged her to participate in the depression education today, as well as speak to her Dr. or pharmacist about a natural OTC sleep aid that is sustained release.  Counselor also provided a local therapist contact information for Ms. Leinweber and will follow up re: these recommendations.     Continued Psychosocial Services Needed Yes  Follow up on sleep, and recommendations for talk therapy to help with unresolved grief and loss issues.  She will benefit from meeting with the dietician for weight loss goals as well as participating in all of the psychoeducational components here.       Psychosocial Re-Evaluation:  Education: Education Goals: Education classes will be provided on a weekly basis, covering required topics. Participant will state understanding/return demonstration of topics presented.  Learning Barriers/Preferences:     Learning Barriers/Preferences - 04/23/15 1115    Learning Barriers/Preferences   Learning Barriers None   Learning Preferences Group Instruction;Individual Instruction;Pictoral;Skilled Demonstration;Verbal Instruction;Video;Written Material      Education Topics: Initial Evaluation Education: - Verbal, written and demonstration of respiratory meds, RPE/PD scales, oximetry and breathing techniques.  Instruction on use of nebulizers and MDIs: cleaning and proper use, rinsing mouth with steroid doses and importance of monitoring MDI activations.          Pulmonary Rehab from 05/20/2015 in [place]   Date  04/23/15   Educator  LB   Instruction Review Code  2- meets goals/outcomes      General Nutrition Guidelines/Fats and Fiber: -Group instruction provided by verbal, written material, models and posters to present the general guidelines for heart healthy nutrition. Gives an explanation and review of dietary fats and fiber.      Pulmonary Rehab from 05/20/2015 in [place]   Date  05/20/15   Educator  PI   Instruction Review Code  2- meets goals/outcomes      Controlling Sodium/Reading Food Labels: -Group verbal and written material supporting the discussion of sodium use in heart healthy nutrition. Review and  explanation with models, verbal and written materials for utilization of the food label.   Exercise Physiology & Risk Factors: - Group verbal and written instruction with models to review the exercise physiology of the cardiovascular system and associated critical values. Details cardiovascular disease risk factors and the goals associated with each risk factor.   Aerobic Exercise & Resistance Training: - Gives group verbal and written discussion on the health impact of inactivity. On the components of aerobic and resistive training programs and the benefits of this training and how to safely progress through these programs.   Flexibility, Balance, General Exercise Guidelines: - Provides group verbal and written instruction on the benefits of flexibility and balance training programs. Provides general exercise guidelines with specific guidelines to those with heart or lung disease. Demonstration and skill practice provided.   Stress Management: - Provides group verbal and written instruction  about the health risks of elevated stress, cause of high stress, and healthy ways to reduce stress.   Depression: - Provides group verbal and written instruction on the correlation between heart/lung disease and depressed mood, treatment options, and the stigmas associated with seeking treatment.      Pulmonary Rehab from 05/20/2015 in [place]   Date  05/01/15   Educator  [facility]   Instruction Review Code  2- meets goals/outcomes      Exercise & Equipment Safety: - Individual verbal instruction and demonstration of equipment use and safety with use of the equipment.      Pulmonary Rehab from 05/20/2015 in [place]   Date  05/01/15   Educator  LB   Instruction Review Code  2- meets goals/outcomes      Infection Prevention: - Provides verbal and written material to individual with discussion of infection control including proper hand washing and proper equipment cleaning during exercise session.      Pulmonary Rehab from 05/20/2015 in [place]   Date  05/01/15   Educator  LB   Instruction Review Code  2- meets goals/outcomes      Falls Prevention: - Provides verbal and written material to individual with discussion of falls prevention and safety.      Pulmonary Rehab from 05/20/2015 in [place]   Date  04/23/15   Educator  LB   Instruction Review Code  2- meets goals/outcomes      Diabetes: - Individual verbal and written instruction to review signs/symptoms of diabetes, desired ranges of glucose level fasting, after meals and with exercise. Advice that pre and post exercise glucose checks will be done for 3 sessions at entry of program.      Pulmonary Rehab from 05/20/2015 in [place]   Date  05/01/15   Educator  LB   Instruction Review Code  2- meets goals/outcomes       Chronic Lung Diseases: - Group verbal and written instruction to review new updates, new respiratory medications, new advancements in procedures and treatments. Provide informative websites and "800" numbers of self-education.   Lung Procedures: - Group verbal and written instruction to describe testing methods done to diagnose lung disease. Review the outcome of test results. Describe the treatment choices: Pulmonary Function Tests, ABGs and oximetry.   Energy Conservation: - Provide group verbal and written instruction for methods to conserve energy, plan and organize activities. Instruct on pacing techniques, use of adaptive equipment and posture/positioning to relieve shortness of  breath.   Triggers: - Group verbal and written instruction to review types of environmental controls: home humidity, furnaces, filters, dust mite/pet prevention, HEPA vacuums. To discuss weather changes, air quality and the benefits of nasal washing.   Exacerbations: - Group verbal and written instruction to provide: warning signs, infection symptoms, calling MD promptly, preventive modes, and value of vaccinations. Review: effective airway clearance, coughing and/or vibration techniques. Create an Sports administrator.   Oxygen: - Individual and group verbal and written instruction on oxygen therapy. Includes supplement oxygen, available portable oxygen systems, continuous and intermittent flow rates, oxygen safety, concentrators, and Medicare reimbursement for oxygen.      Pulmonary Rehab from 05/20/2015 in [place]   Date  04/23/15   Educator  lb   Instruction Review Code  2- meets goals/outcomes      Respiratory Medications: - Group verbal and written instruction to review medications for lung disease. Drug class, frequency, complications, importance of spacers, rinsing mouth after steroid MDI's, and proper cleaning methods for nebulizers.      Pulmonary Rehab from  05/20/2015 in [place]   Date  04/23/15   Educator  LB   Instruction Review Code  2- meets goals/outcomes      AED/CPR: - Group verbal and written instruction with the use of models to demonstrate the basic use of the AED with the basic ABC's of resuscitation.   Breathing Retraining: - Provides individuals verbal and written instruction on purpose, frequency, and proper technique of diaphragmatic breathing and pursed-lipped breathing. Applies individual practice skills.      Pulmonary Rehab from 05/20/2015 in [place]   Date  04/23/15   Educator  LB   Instruction Review Code  2- meets goals/outcomes      Anatomy and Physiology of the Lungs: - Group verbal and written instruction with the use of models to provide basic lung anatomy and physiology related to function, structure and complications of lung disease.   Heart Failure: - Group verbal and written instruction on the basics of heart failure: signs/symptoms, treatments, explanation of ejection fraction, enlarged heart and cardiomyopathy.   Sleep Apnea: - Individual verbal and written instruction to review Obstructive Sleep Apnea. Review of risk factors, methods for diagnosing and types of masks and machines for OSA.   Anxiety: - Provides group, verbal and written instruction on the correlation between heart/lung disease and anxiety, treatment options, and management of anxiety.   Relaxation: - Provides group, verbal and written instruction about the benefits of relaxation for patients with heart/lung disease. Also provides patients with examples of relaxation techniques.   Knowledge Questionnaire Score:     Knowledge Questionnaire Score - 04/23/15 1115    Knowledge Questionnaire Score   Pre Score -1      Personal Goals and Risk Factors at Admission:     Personal Goals and Risk Factors at Admission - 04/23/15 1115    Personal Goals  and Risk Factors on Admission    Weight Management Yes   Intervention Learn and follow the exercise and diet guidelines while in the program. Utilize the nutrition and education classes to help gain knowledge of the diet and exercise expectations in the program  Ms Shampine would like to meet with the dietitian; she cooks a healthy meal twice a week and has leftovers; she likes peanut butter sandwiches; very hard to cook for one person; she does dring 8gl of fluid a day  Goal Weight 150 lb (68.04 kg)   Increase Aerobic Exercise and Physical Activity Yes   Intervention While in program, learn and follow the exercise prescription taught. Start at a low level workload and increase workload after able to maintain previous level for 30 minutes. Increase time before increasing intensity.  Ms Vogan has never been so out of shape and is looking forward to a change in her lifestyle with exercise a part of it. Plan to check if she has [employer] research officer, government.   Understand more about Heart/Pulmonary Disease. Yes   Intervention While in program utilize professionals for any questions, and attend the education sessions. Great websites to use are www.americanheart.org or www.lung.org for reliable information.  Ms Mccary is interested in education about COPD and daily management of her disease.   Improve shortness of breath with ADL's Yes   Intervention While in program, learn and follow the exercise prescription taught. Start at a low level workload and increase workload ad advised by the exercise physiologist. Increase time before increasing intensity.  Ms Whitehair would like to improve her shortness of breath, especially with housework.   Develop more efficient breathing techniques such as purse lipped breathing and diaphragmatic breathing; and practicing self-pacing with activity Yes   Intervention While in program, learn and utilize the specific breathing techniques taught to you. Continue to practice and use the techniques as  needed.   Increase knowledge of respiratory medications and ability to use respiratory devices properly.  Yes   Intervention While in program learn and demonstrate appropriate use of your oxygen therapy by increasing flow with exertion, manage oxygen tank operation, including continuous and intermittent flow.  Understanding oxygen is a drug ordered by your physician.;While in program, learn to administer MDI, nebulizer, and spacer properly.;Learn to take respiratory medicine as ordered.;While in program, learn to Clean MDI, nebulizers, and spacers properly.  Ms Flury uses [place], Spiriva, and Ventolin for inhalers. I gave her a spacer for her Ventolin MDI   with instructions. She is on 2l/m oxygen from [employer], and her portable system is a concentrator at intermittent flow.   Diabetes Yes   Goal Blood glucose control identified by blood glucose values, HgbA1C. Participant verbalizes understanding of the signs/symptoms of hyper/hypo glycemia, proper foot care and importance of medication and nutrition plan for blood glucose control.   Intervention Provide nutrition & aerobic exercise along with prescribed medications to achieve blood glucose in normal ranges: Fasting 65-99 mg/dL   Hypertension Yes   Goal Participant will see blood pressure controlled within the values of 140/42m/Hg or within value directed by their physician.   Intervention Provide nutrition & aerobic exercise along with prescribed medications to achieve BP 140/90 or less.      Personal Goals and Risk Factors Review:      Goals and Risk Factor Review      05/01/15 1000 05/06/15 1000 05/08/15 1107 05/14/15 0639     Increase Aerobic Exercise and Physical Activity   Goals Progress/Improvement seen   Yes Yes     Comments  Ms KHerneis working on increasing her exercise goals in [facility] and has increased her exercise time from 478mutes to 5040m. [facility] also [facility] will [facility]. Reviewed  individualized exercise prescription and made increases per departmental policy. Exercise increases were discussed with the patient and they were able to perform the new work loads without issue (no signs or symptoms).      Improve shortness of  breath with ADL's   Goals Progress/Improvement seen   Yes      Comments  Ms Nichter has good PD scores with her exercise and will encourage her to continue increasing her intensiy with her exercise goals.      Diabetes   Goal --  Ms Puett has a low glucose before [facility] class. She ate at  4:00am, so I educated her on snacking before class and  a good snack with protein. --  Second reading pre and post exercise: 167 and post 113;  --  Ms Niedermeier's glucose level was checked 3 sessions before exercise and after exercise. This record she can show Dr Kary Kos and encouraged her to have an A!C drawn.    Hypertension   Goal  --  Maintaining acceptable BP at rest and Exercise in [facility]  --  Acceptable BP with exercise and at rest in Lung       Personal Goals Discharge (Final Personal Goals and Risk Factors Review):      Goals and Risk Factor Review - 05/14/15 0639    Diabetes   Goal --  Ms Maynes's glucose level was checked 3 sessions before exercise and after exercise. This record she can show Dr Kary Kos and encouraged her to have an A!C drawn.   Hypertension   Goal --  Acceptable BP with exercise and at rest in Lung      ITP Comments:     ITP Comments      07/15/15 0905 08/06/15 1108         ITP Comments Called Ms Nabor and left a message on 07/10/2015; she has been out since 05/20/2015 Ms Kawamoto last attended 05/20/2015. We have called her several times, but she has not responed.         Comments:  Ms Housh last attended 05/20/2015. We have called her several times, but she has not responed. 30 day note review.

## 2015-08-15 DIAGNOSIS — J449 Chronic obstructive pulmonary disease, unspecified: Secondary | ICD-10-CM | POA: Diagnosis not present

## 2015-08-21 NOTE — Addendum Note (Signed)
Addended by: Alonza BogusBROWN, LAUREEN M on: 08/21/2015 08:40 AM   Modules accepted: Orders

## 2015-08-21 NOTE — Progress Notes (Signed)
Pulmonary Individual Treatment Plan  Patient Details  Name: [name] MRN: [id number] Date of Birth: [birth year] Referring Provider:  [place] Pink, MD  Initial Encounter Date: 04/23/2015  Visit Diagnosis: COPD, moderate ([place]) - Plan: PULMONARY REHAB 30 DAY REVIEW  Patient's Home Medications on Admission:  Current outpatient prescriptions:    acetaminophen (TYLENOL) 500 MG tablet, Take 2 tablets by mouth every 6 (six) hours as needed., Disp: , Rfl:    albuterol (PROAIR HFA) 108 (90 BASE) MCG/ACT inhaler, Inhale 2 puffs into the lungs every 6 (six) hours as needed. , Disp: , Rfl:    aspirin (ASPIRIN LOW DOSE) 81 MG tablet, Take 81 mg by mouth daily. , Disp: , Rfl:    atorvastatin (LIPITOR) 80 MG tablet, Take 80 mg by mouth daily., Disp: , Rfl:    clopidogrel (PLAVIX) 75 MG tablet, TAKE 1 TABLET ONE TIME DAILY, Disp: , Rfl:    DULoxetine (CYMBALTA) 60 MG capsule, Take 60 mg by mouth daily. , Disp: , Rfl:    Fluticasone Furoate-Vilanterol (BREO ELLIPTA) 100-25 MCG/INH AEPB, Inhale 1 puff into the lungs daily. , Disp: , Rfl:    lisinopril (PRINIVIL,ZESTRIL) 5 MG tablet, TAKE 1 TABLET ONE TIME DAILY, Disp: , Rfl:    metoprolol (TOPROL-XL) 200 MG 24 hr tablet, Take 200 mg by mouth daily. , Disp: , Rfl:    tiotropium (SPIRIVA) 18 MCG inhalation capsule, Place 18 mcg into inhaler and inhale daily. , Disp: , Rfl:    torsemide (DEMADEX) 20 MG tablet, TAKE 1 TABLET ONE TIME DAILY, Disp: , Rfl:   Past Medical History: Past Medical History  Diagnosis Date   COPD (chronic obstructive pulmonary disease) ([place])    Hyperlipidemia    Hypertension    Diabetes ([place])    Peripheral vascular disease ([place])    Myocardial infarction ([place])     Tobacco Use: History  Smoking status   Former Smoker -- 2.00 packs/day for 47 years   Types: Cigarettes   Quit date: 08/11/2011  Smokeless tobacco   Never Used    Labs: Recent Review Flowsheet Data    Labs for ITP Cardiac and  Pulmonary Rehab Latest Ref Rng 07/29/2012   Cholestrol 0-200 mg/dL 109   LDLCALC 0-100 mg/dL 57   HDL 40-60 mg/dL 31(L)   Trlycerides 0-200 mg/dL 105         POCT Glucose      05/01/15 1226 05/06/15 1000         POCT Blood Glucose   Pre-Exercise 73 mg/dL       Post-Exercise 134 mg/dL       Pre-Exercise #2  167 mg/dL      Post-Exercise #2  113 mg/dL         ADL UCSD:     ADL UCSD      04/23/15 1115       ADL UCSD   ADL Phase Entry     SOB Score total 78     Rest 0     Walk 3     Stairs 5     Bath 3     Dress 3     Shop 4         Pulmonary Function Assessment:     Pulmonary Function Assessment - 04/23/15 1115    Pulmonary Function Tests   RV% 117 %   DLCO% 42.5 %   Initial Spirometry Results   FVC% 70 %   FEV1% 45 %   FEV1/FVC Ratio 50  Post Bronchodilator Spirometry Results   FVC% 66.8 %   FEV1% 43.3 %   FEV1/FVC Ratio 50   Breath   Bilateral Breath Sounds Decreased;Clear   Shortness of Breath Yes      Exercise Target Goals:    Exercise Program Goal: Individual exercise prescription set with THRR, safety & activity barriers. Participant demonstrates ability to understand and report RPE using BORG scale, to self-measure pulse accurately, and to acknowledge the importance of the exercise prescription.  Exercise Prescription Goal: Starting with aerobic activity 30 plus minutes a day, 3 days per week for initial exercise prescription. Provide home exercise prescription and guidelines that participant acknowledges understanding prior to discharge.  Activity Barriers & Risk Stratification:     Activity Barriers & Risk Stratification - 04/23/15 1115    Activity Barriers & Risk Stratification   Activity Barriers Deconditioning;Muscular Weakness;Shortness of Breath;Assistive Device   Risk Stratification Moderate      6 Minute Walk:     6 Minute Walk      04/23/15 1648       6 Minute Walk   Phase Initial     Distance 460 feet     Walk  Time 5.5 minutes     Resting HR 68 bpm     Resting BP 134/82 mmHg     Max Ex. HR 79 bpm     Max Ex. BP 138/70 mmHg     RPE 18     Perceived Dyspnea  8     Symptoms No        Initial Exercise Prescription:     Initial Exercise Prescription - 04/23/15 1600    Date of Initial Exercise Prescription   Date 04/23/15   Treadmill   MPH 1   Grade 0   Minutes 10   Recumbant Bike   Level 2   RPM 40   Watts 20   Minutes 10   NuStep   Level 2   Watts 40   Minutes 10   Arm Ergometer   Level 1   Watts 10   Minutes 10   REL-XR   Level 2   Watts 40   Minutes 10   Prescription Details   Frequency (times per week) 3   Duration Progress to 30 minutes of continuous aerobic without signs/symptoms of physical distress   Intensity   THRR REST +  30   Ratings of Perceived Exertion 11-15   Perceived Dyspnea 2-4   Progression Continue progressive overload as per policy without signs/symptoms or physical distress.   Resistance Training   Training Prescription Yes   Weight 2   Reps 10-15      Exercise Prescription Changes:     Exercise Prescription Changes      05/01/15 1200 05/08/15 1100 05/13/15 1100 05/20/15 1100 06/10/15 1000   Exercise Review   Progression  Yes  Yes No   Response to Exercise   Blood Pressure (Admit) 142/76 mmHg  110/66 mmHg 110/66 mmHg    Blood Pressure (Exercise)   132/72 mmHg 132/72 mmHg    Blood Pressure (Exit) 128/80 mmHg  120/70 mmHg 120/70 mmHg    Heart Rate (Admit) 72 bpm  65 bpm 65 bpm    Heart Rate (Exercise) 75 bpm  105 bpm 105 bpm    Heart Rate (Exit) 72 bpm  89 bpm 89 bpm    Oxygen Saturation (Admit) 98 %  95 % 95 %    Oxygen Saturation (Exercise) 95 %  97 %  97 %    Oxygen Saturation (Exit) 99 %  98 % 98 %    Rating of Perceived Exertion (Exercise) _0 Perceived Dyspnea (Exercise) 0  2 2    Symptoms No  No No    Comments  Is very compliant with all exercise increases and goals and appreciates the challenges  The treadmill was  bothering the patient's knees so she used the AE instead today.  Patient has not attended since last review, therefore, no changes warranted.   Duration Progress to 30 minutes of continuous aerobic without signs/symptoms of physical distress Progress to 30 minutes of continuous aerobic without signs/symptoms of physical distress Progress to 30 minutes of continuous aerobic without signs/symptoms of physical distress Progress to 30 minutes of continuous aerobic without signs/symptoms of physical distress Progress to 30 minutes of continuous aerobic without signs/symptoms of physical distress   Intensity Rest + 30 Rest + 30 THRR unchanged THRR unchanged THRR unchanged   Progression Continue progressive overload as per policy without signs/symptoms or physical distress. Continue progressive overload as per policy without signs/symptoms or physical distress.      Resistance Training   Training Prescription _1    Weight _2 Reps 10-12 10-12 10-12 10-12 10-12   Interval Training   Interval Training _3    Treadmill   MPH _4 Grade 0 0 0 0 0   Minutes _5 NuStep   Level _6 Watts 20 20 40 40 40   Minutes _7 Arm Ergometer   Level    1 1   Watts    10 10   Minutes    10 10   Recumbant Elliptical   Level 1  BioStep 2  BioStep 2  BioStep 2  BioStep 2  BioStep   Watts _8 Minutes _9 07/09/15 1600           Exercise Review   Progression No       Response to Exercise   Comments Patient has not attended since last review, therefore, no changes warranted.       Duration Progress to 30 minutes of continuous aerobic without signs/symptoms of physical distress       Intensity THRR unchanged       Resistance Training   Training Prescription Yes       Weight 1       Reps 10-12       Interval Training   Interval Training No       Treadmill   MPH 1       Grade 0       Minutes 10        NuStep   Level 2       Watts 40       Minutes 15       Arm Ergometer   Level 1       Watts 10       Minutes 10       Recumbant Elliptical   Level 2  BioStep       Watts 20       Minutes 15          Discharge Exercise Prescription (Final  Exercise Prescription Changes):     Exercise Prescription Changes - 07/09/15 1600    Exercise Review   Progression No   Response to Exercise   Comments Patient has not attended since last review, therefore, no changes warranted.   Duration Progress to 30 minutes of continuous aerobic without signs/symptoms of physical distress   Intensity THRR unchanged   Resistance Training   Training Prescription Yes   Weight 1   Reps 10-12   Interval Training   Interval Training No   Treadmill   MPH 1   Grade 0   Minutes 10   NuStep   Level 2   Watts 40   Minutes 15   Arm Ergometer   Level 1   Watts 10   Minutes 10   Recumbant Elliptical   Level 2  BioStep   Watts 20   Minutes 15       Nutrition:  Target Goals: Understanding of nutrition guidelines, daily intake of sodium <1564m, cholesterol <2033m calories 30% from fat and 7% or less from saturated fats, daily to have 5 or more servings of fruits and vegetables.  Biometrics:     Pre Biometrics - 04/23/15 1653    Pre Biometrics   Height _0  (1.702 m)   Waist Circumference 46 inches   Hip Circumference 49.5 inches   Waist to Hip Ratio 0.93 %       Nutrition Therapy Plan and Nutrition Goals:     Nutrition Therapy & Goals - 04/23/15 1115    Nutrition Therapy   Diet Ms KiMatarould like to meet with the dietitian; she cooks a healthy meal twice a week and has leftovers; she likes peanut butter sandwiches; very hard to cook for one person; she does dring 8gl of fluid a day      Nutrition Discharge: Rate Your Plate Scores:   Psychosocial: Target Goals: Acknowledge presence or absence of depression, maximize coping skills, provide positive support system. Participant  is able to verbalize types and ability to use techniques and skills needed for reducing stress and depression.  Initial Review & Psychosocial Screening:     Initial Psych Review & Screening - 04/23/15 1115    Initial Review   Current issues with Current Depression   Family Dynamics   Good Support System? Yes   Concerns Recent loss of significant other   Comments Ms KiWinbornes still suffering the loss of her husband of 2026years he died about [id number].  She does have good support from her 2 sons from her first marriage and enjoys her grand children.   Barriers   Psychosocial barriers to participate in program The patient should benefit from training in stress management and relaxation.   Screening Interventions   Interventions Program counselor consult;Encouraged to exercise      Quality of Life Scores:     Quality of Life - 04/23/15 1115    Quality of Life Scores   Health/Function Pre 9.03 %   Socioeconomic Pre 15.64 %   Psych/Spiritual Pre 9.25 %   Family Pre 25.13 %   GLOBAL Pre 12.53 %      PHQ-9:     Recent Review Flowsheet Data    [facility] 04/23/2015   Decreased Interest 1 1   Down, Depressed, Hopeless 1 1   PHQ - 2 Score 2 2   Altered sleeping 1 1   Tired, decreased energy 1 1   Change in appetite 1 1  Feeling bad or failure about yourself  1 1   Trouble concentrating 0 1   Moving slowly or fidgety/restless 0 0   Suicidal thoughts 0 0   PHQ-9 Score 6 7   Difficult doing work/chores Somewhat difficult Somewhat difficult      Psychosocial Evaluation and Intervention:     Psychosocial Evaluation - 05/01/15 1051    Psychosocial Evaluation & Interventions   Interventions Stress management education;Relaxation education;Encouraged to exercise with the program and follow exercise prescription   Comments Counselor met with Ms. Sutherland today for initial psychosocial evaluation.  She is a 70 year old with COPD who has a strong support system  of two adult sons and a sister who live close by.  Ms. Raliegh Ip is also part of a local church community.  She reports not sleeping well and minimal appetite.  She has tried several OTC sleep aids without success.  Ms. Raliegh Ip reports losing her spouse to lung cancer 5 years ago and has not slept well since then with maximum of 3-4 hours intermittent sleep per night.  Ms. Raliegh Ip denies a history of depression or anxiety, but does exhibit some symptoms of depression currently.  Her mood was a "5" on a scale of 1-10, and she stated if she "lost 25 pounds and got some sleep" she would feel better.  Ms. Raliegh Ip has goals to lose weight, breathe better and be able to line dance with her sister and her son as her goals for this program.  Counselor encouraged her to participate in the depression education today, as well as speak to her Dr. or pharmacist about a natural OTC sleep aid that is sustained release.  Counselor also provided a local therapist contact information for Ms. Riccobono and will follow up re: these recommendations.     Continued Psychosocial Services Needed Yes  Follow up on sleep, and recommendations for talk therapy to help with unresolved grief and loss issues.  She will benefit from meeting with the dietician for weight loss goals as well as participating in all of the psychoeducational components here.       Psychosocial Re-Evaluation:  Education: Education Goals: Education classes will be provided on a weekly basis, covering required topics. Participant will state understanding/return demonstration of topics presented.  Learning Barriers/Preferences:     Learning Barriers/Preferences - 04/23/15 1115    Learning Barriers/Preferences   Learning Barriers None   Learning Preferences Group Instruction;Individual Instruction;Pictoral;Skilled Demonstration;Verbal Instruction;Video;Written Material      Education Topics: Initial Evaluation Education: - Verbal, written and demonstration of respiratory meds,  RPE/PD scales, oximetry and breathing techniques. Instruction on use of nebulizers and MDIs: cleaning and proper use, rinsing mouth with steroid doses and importance of monitoring MDI activations.          Pulmonary Rehab from 05/20/2015 in [place]   Date  04/23/15   Educator  LB   Instruction Review Code  2- meets goals/outcomes      General Nutrition Guidelines/Fats and Fiber: -Group instruction provided by verbal, written material, models and posters to present the general guidelines for heart healthy nutrition. Gives an explanation and review of dietary fats and fiber.      Pulmonary Rehab from 05/20/2015 in [place]   Date  05/20/15   Educator  PI   Instruction Review Code  2- meets goals/outcomes      Controlling Sodium/Reading Food Labels: -Group verbal and written material supporting the discussion of sodium  use in heart healthy nutrition. Review and explanation with models, verbal and written materials for utilization of the food label.   Exercise Physiology & Risk Factors: - Group verbal and written instruction with models to review the exercise physiology of the cardiovascular system and associated critical values. Details cardiovascular disease risk factors and the goals associated with each risk factor.   Aerobic Exercise & Resistance Training: - Gives group verbal and written discussion on the health impact of inactivity. On the components of aerobic and resistive training programs and the benefits of this training and how to safely progress through these programs.   Flexibility, Balance, General Exercise Guidelines: - Provides group verbal and written instruction on the benefits of flexibility and balance training programs. Provides general exercise guidelines with specific guidelines to those with heart or lung disease. Demonstration and skill practice provided.   Stress Management: -  Provides group verbal and written instruction about the health risks of elevated stress, cause of high stress, and healthy ways to reduce stress.   Depression: - Provides group verbal and written instruction on the correlation between heart/lung disease and depressed mood, treatment options, and the stigmas associated with seeking treatment.      Pulmonary Rehab from 05/20/2015 in [place]   Date  05/01/15   Educator  [facility]   Instruction Review Code  2- meets goals/outcomes      Exercise & Equipment Safety: - Individual verbal instruction and demonstration of equipment use and safety with use of the equipment.      Pulmonary Rehab from 05/20/2015 in [place]   Date  05/01/15   Educator  LB   Instruction Review Code  2- meets goals/outcomes      Infection Prevention: - Provides verbal and written material to individual with discussion of infection control including proper hand washing and proper equipment cleaning during exercise session.      Pulmonary Rehab from 05/20/2015 in [place]   Date  05/01/15   Educator  LB   Instruction Review Code  2- meets goals/outcomes      Falls Prevention: - Provides verbal and written material to individual with discussion of falls prevention and safety.      Pulmonary Rehab from 05/20/2015 in [place] [facility] [place]   Date  04/23/15   Educator  LB   Instruction Review Code  2- meets goals/outcomes      Diabetes: - Individual verbal and written instruction to review signs/symptoms of diabetes, desired ranges of glucose level fasting, after meals and with exercise. Advice that pre and post exercise glucose checks will be done for 3 sessions at entry of program.      Pulmonary Rehab from 05/20/2015 in [place]   Date  05/01/15   Educator  LB   Instruction Review  Code  2- meets goals/outcomes      Chronic Lung Diseases: - Group verbal and written instruction to review new updates, new respiratory medications, new advancements in procedures and treatments. Provide informative websites and "800" numbers of self-education.   Lung Procedures: - Group verbal and written instruction to describe testing methods done to diagnose lung disease. Review the outcome of test results. Describe the treatment choices: Pulmonary Function Tests, ABGs and oximetry.   Energy Conservation: - Provide group verbal and written instruction for methods to conserve energy, plan and organize activities. Instruct on pacing techniques, use of adaptive  equipment and posture/positioning to relieve shortness of breath.   Triggers: - Group verbal and written instruction to review types of environmental controls: home humidity, furnaces, filters, dust mite/pet prevention, HEPA vacuums. To discuss weather changes, air quality and the benefits of nasal washing.   Exacerbations: - Group verbal and written instruction to provide: warning signs, infection symptoms, calling MD promptly, preventive modes, and value of vaccinations. Review: effective airway clearance, coughing and/or vibration techniques. Create an Sports administrator.   Oxygen: - Individual and group verbal and written instruction on oxygen therapy. Includes supplement oxygen, available portable oxygen systems, continuous and intermittent flow rates, oxygen safety, concentrators, and Medicare reimbursement for oxygen.      Pulmonary Rehab from 05/20/2015 in [place]   Date  04/23/15   Educator  lb   Instruction Review Code  2- meets goals/outcomes      Respiratory Medications: - Group verbal and written instruction to review medications for lung disease. Drug class, frequency, complications, importance of spacers, rinsing mouth after steroid MDI's, and proper cleaning methods for  nebulizers.      Pulmonary Rehab from 05/20/2015 in [place]   Date  04/23/15   Educator  LB   Instruction Review Code  2- meets goals/outcomes      AED/CPR: - Group verbal and written instruction with the use of models to demonstrate the basic use of the AED with the basic ABC's of resuscitation.   Breathing Retraining: - Provides individuals verbal and written instruction on purpose, frequency, and proper technique of diaphragmatic breathing and pursed-lipped breathing. Applies individual practice skills.      Pulmonary Rehab from 05/20/2015 in [place]   Date  04/23/15   Educator  LB   Instruction Review Code  2- meets goals/outcomes      Anatomy and Physiology of the Lungs: - Group verbal and written instruction with the use of models to provide basic lung anatomy and physiology related to function, structure and complications of lung disease.   Heart Failure: - Group verbal and written instruction on the basics of heart failure: signs/symptoms, treatments, explanation of ejection fraction, enlarged heart and cardiomyopathy.   Sleep Apnea: - Individual verbal and written instruction to review Obstructive Sleep Apnea. Review of risk factors, methods for diagnosing and types of masks and machines for OSA.   Anxiety: - Provides group, verbal and written instruction on the correlation between heart/lung disease and anxiety, treatment options, and management of anxiety.   Relaxation: - Provides group, verbal and written instruction about the benefits of relaxation for patients with heart/lung disease. Also provides patients with examples of relaxation techniques.   Knowledge Questionnaire Score:     Knowledge Questionnaire Score - 04/23/15 1115    Knowledge Questionnaire Score   Pre Score -1      Personal Goals and Risk Factors at Admission:     Personal Goals and Risk Factors at  Admission - 04/23/15 1115    Personal Goals and Risk Factors on Admission    Weight Management Yes   Intervention Learn and follow the exercise and diet guidelines while in the program. Utilize the nutrition and education classes to help gain knowledge of the diet and exercise expectations in the program  Ms Windle would like to meet with the dietitian; she cooks a healthy meal twice a week and has leftovers; she likes peanut butter sandwiches; very hard to cook for one person; she does dring  8gl of fluid a day   Goal Weight 150 lb (68.04 kg)   Increase Aerobic Exercise and Physical Activity Yes   Intervention While in program, learn and follow the exercise prescription taught. Start at a low level workload and increase workload after able to maintain previous level for 30 minutes. Increase time before increasing intensity.  Ms Schwoerer has never been so out of shape and is looking forward to a change in her lifestyle with exercise a part of it. Plan to check if she has [employer] research officer, government.   Understand more about Heart/Pulmonary Disease. Yes   Intervention While in program utilize professionals for any questions, and attend the education sessions. Great websites to use are www.americanheart.org or www.lung.org for reliable information.  Ms Kindler is interested in education about COPD and daily management of her disease.   Improve shortness of breath with ADL's Yes   Intervention While in program, learn and follow the exercise prescription taught. Start at a low level workload and increase workload ad advised by the exercise physiologist. Increase time before increasing intensity.  Ms Mawhinney would like to improve her shortness of breath, especially with housework.   Develop more efficient breathing techniques such as purse lipped breathing and diaphragmatic breathing; and practicing self-pacing with activity Yes   Intervention While in program, learn and utilize the specific breathing techniques taught to you.  Continue to practice and use the techniques as needed.   Increase knowledge of respiratory medications and ability to use respiratory devices properly.  Yes   Intervention While in program learn and demonstrate appropriate use of your oxygen therapy by increasing flow with exertion, manage oxygen tank operation, including continuous and intermittent flow.  Understanding oxygen is a drug ordered by your physician.;While in program, learn to administer MDI, nebulizer, and spacer properly.;Learn to take respiratory medicine as ordered.;While in program, learn to Clean MDI, nebulizers, and spacers properly.  Ms Yaun uses Passapatanzy, Spiriva, and Ventolin for inhalers. I gave her a spacer for her Ventolin MDI   with instructions. She is on 2l/m oxygen from [employer], and her portable system is a concentrator at intermittent flow.   Diabetes Yes   Goal Blood glucose control identified by blood glucose values, HgbA1C. Participant verbalizes understanding of the signs/symptoms of hyper/hypo glycemia, proper foot care and importance of medication and nutrition plan for blood glucose control.   Intervention Provide nutrition & aerobic exercise along with prescribed medications to achieve blood glucose in normal ranges: Fasting 65-99 mg/dL   Hypertension Yes   Goal Participant will see blood pressure controlled within the values of 140/79m/Hg or within value directed by their physician.   Intervention Provide nutrition & aerobic exercise along with prescribed medications to achieve BP 140/90 or less.      Personal Goals and Risk Factors Review:      Goals and Risk Factor Review      05/01/15 1000 05/06/15 1000 05/08/15 1107 05/14/15 0639     Increase Aerobic Exercise and Physical Activity   Goals Progress/Improvement seen   Yes Yes     Comments  Ms KHardieis working on increasing her exercise goals in [facility] and has increased her exercise time from 424mutes to 5038m. [facility]. Reviewed individualized exercise prescription and made increases per departmental policy. Exercise increases were discussed with the patient and they were able to perform the new work loads without issue (no signs or symptoms).  Improve shortness of breath with ADL's   Goals Progress/Improvement seen   Yes      Comments  Ms Feld has good PD scores with her exercise and will encourage her to continue increasing her intensiy with her exercise goals.      Diabetes   Goal --  Ms Moncayo has a low glucose before [facility] class. She ate at  4:00am, so I educated her on snacking before class and  a good snack with protein. --  Second reading pre and post exercise: 167 and post 113;  --  Ms Klepacki's glucose level was checked 3 sessions before exercise and after exercise. This record she can show Dr Kary Kos and encouraged her to have an A!C drawn.    Hypertension   Goal  --  Maintaining acceptable BP at rest and Exercise in [facility]  --  Acceptable BP with exercise and at rest in Lung       Personal Goals Discharge (Final Personal Goals and Risk Factors Review):      Goals and Risk Factor Review - 05/14/15 0639    Diabetes   Goal --  Ms Game's glucose level was checked 3 sessions before exercise and after exercise. This record she can show Dr Kary Kos and encouraged her to have an A!C drawn.   Hypertension   Goal --  Acceptable BP with exercise and at rest in Lung      ITP Comments:     ITP Comments      07/15/15 0905 08/06/15 1108 08/21/15 0834       ITP Comments Called Ms Sarti and left a message on 07/10/2015; she has been out since 05/20/2015 Ms Wixon last attended 05/20/2015. We have called her several times, but she has not responed. Ms Sandefur last attended 05/20/2015. She has been called multiple times with no response. We are discharging Ms Kettlewell from [employer].        Comments: Ms Towle last attended 05/20/2015. Multiple calls have been made to her  with no response. We are discharging Ms Ridgeway from [employer].

## 2015-08-28 DIAGNOSIS — J449 Chronic obstructive pulmonary disease, unspecified: Secondary | ICD-10-CM | POA: Diagnosis not present

## 2015-09-15 DIAGNOSIS — J449 Chronic obstructive pulmonary disease, unspecified: Secondary | ICD-10-CM | POA: Diagnosis not present

## 2015-09-28 DIAGNOSIS — J449 Chronic obstructive pulmonary disease, unspecified: Secondary | ICD-10-CM | POA: Diagnosis not present

## 2015-10-13 DIAGNOSIS — J449 Chronic obstructive pulmonary disease, unspecified: Secondary | ICD-10-CM | POA: Diagnosis not present

## 2015-10-26 DIAGNOSIS — J449 Chronic obstructive pulmonary disease, unspecified: Secondary | ICD-10-CM | POA: Diagnosis not present

## 2015-11-13 DIAGNOSIS — J449 Chronic obstructive pulmonary disease, unspecified: Secondary | ICD-10-CM | POA: Diagnosis not present

## 2015-11-26 DIAGNOSIS — J449 Chronic obstructive pulmonary disease, unspecified: Secondary | ICD-10-CM | POA: Diagnosis not present

## 2015-12-02 DIAGNOSIS — S79911A Unspecified injury of right hip, initial encounter: Secondary | ICD-10-CM | POA: Diagnosis not present

## 2015-12-02 DIAGNOSIS — E119 Type 2 diabetes mellitus without complications: Secondary | ICD-10-CM | POA: Diagnosis not present

## 2015-12-02 DIAGNOSIS — M25551 Pain in right hip: Secondary | ICD-10-CM | POA: Diagnosis not present

## 2015-12-02 DIAGNOSIS — I10 Essential (primary) hypertension: Secondary | ICD-10-CM | POA: Diagnosis not present

## 2015-12-02 DIAGNOSIS — M25512 Pain in left shoulder: Secondary | ICD-10-CM | POA: Diagnosis not present

## 2015-12-13 DIAGNOSIS — J449 Chronic obstructive pulmonary disease, unspecified: Secondary | ICD-10-CM | POA: Diagnosis not present

## 2015-12-20 DIAGNOSIS — I429 Cardiomyopathy, unspecified: Secondary | ICD-10-CM | POA: Diagnosis not present

## 2015-12-20 DIAGNOSIS — J449 Chronic obstructive pulmonary disease, unspecified: Secondary | ICD-10-CM | POA: Diagnosis not present

## 2015-12-20 DIAGNOSIS — R0609 Other forms of dyspnea: Secondary | ICD-10-CM | POA: Diagnosis not present

## 2015-12-26 DIAGNOSIS — J449 Chronic obstructive pulmonary disease, unspecified: Secondary | ICD-10-CM | POA: Diagnosis not present

## 2016-01-13 DIAGNOSIS — J449 Chronic obstructive pulmonary disease, unspecified: Secondary | ICD-10-CM | POA: Diagnosis not present

## 2016-01-26 DIAGNOSIS — J449 Chronic obstructive pulmonary disease, unspecified: Secondary | ICD-10-CM | POA: Diagnosis not present

## 2016-02-12 DIAGNOSIS — J449 Chronic obstructive pulmonary disease, unspecified: Secondary | ICD-10-CM | POA: Diagnosis not present

## 2016-02-25 DIAGNOSIS — J449 Chronic obstructive pulmonary disease, unspecified: Secondary | ICD-10-CM | POA: Diagnosis not present

## 2016-03-14 DIAGNOSIS — J449 Chronic obstructive pulmonary disease, unspecified: Secondary | ICD-10-CM | POA: Diagnosis not present

## 2016-03-19 DIAGNOSIS — J449 Chronic obstructive pulmonary disease, unspecified: Secondary | ICD-10-CM | POA: Diagnosis not present

## 2016-03-19 DIAGNOSIS — R0609 Other forms of dyspnea: Secondary | ICD-10-CM | POA: Diagnosis not present

## 2016-03-19 DIAGNOSIS — J984 Other disorders of lung: Secondary | ICD-10-CM | POA: Diagnosis not present

## 2016-03-19 DIAGNOSIS — R0902 Hypoxemia: Secondary | ICD-10-CM | POA: Diagnosis not present

## 2016-03-27 DIAGNOSIS — J449 Chronic obstructive pulmonary disease, unspecified: Secondary | ICD-10-CM | POA: Diagnosis not present

## 2016-04-14 DIAGNOSIS — J449 Chronic obstructive pulmonary disease, unspecified: Secondary | ICD-10-CM | POA: Diagnosis not present

## 2016-04-21 DIAGNOSIS — R21 Rash and other nonspecific skin eruption: Secondary | ICD-10-CM | POA: Diagnosis not present

## 2016-04-27 DIAGNOSIS — J449 Chronic obstructive pulmonary disease, unspecified: Secondary | ICD-10-CM | POA: Diagnosis not present

## 2016-05-14 DIAGNOSIS — J449 Chronic obstructive pulmonary disease, unspecified: Secondary | ICD-10-CM | POA: Diagnosis not present

## 2016-05-27 DIAGNOSIS — J449 Chronic obstructive pulmonary disease, unspecified: Secondary | ICD-10-CM | POA: Diagnosis not present

## 2016-06-09 DIAGNOSIS — R21 Rash and other nonspecific skin eruption: Secondary | ICD-10-CM | POA: Diagnosis not present

## 2016-06-09 DIAGNOSIS — I10 Essential (primary) hypertension: Secondary | ICD-10-CM | POA: Diagnosis not present

## 2016-06-09 DIAGNOSIS — J9611 Chronic respiratory failure with hypoxia: Secondary | ICD-10-CM | POA: Diagnosis not present

## 2016-06-09 DIAGNOSIS — F5101 Primary insomnia: Secondary | ICD-10-CM | POA: Diagnosis not present

## 2016-06-09 DIAGNOSIS — I2510 Atherosclerotic heart disease of native coronary artery without angina pectoris: Secondary | ICD-10-CM | POA: Diagnosis not present

## 2016-06-09 DIAGNOSIS — E1151 Type 2 diabetes mellitus with diabetic peripheral angiopathy without gangrene: Secondary | ICD-10-CM | POA: Diagnosis not present

## 2016-06-09 DIAGNOSIS — N183 Chronic kidney disease, stage 3 (moderate): Secondary | ICD-10-CM | POA: Diagnosis not present

## 2016-06-09 DIAGNOSIS — Z23 Encounter for immunization: Secondary | ICD-10-CM | POA: Diagnosis not present

## 2016-06-14 DIAGNOSIS — J449 Chronic obstructive pulmonary disease, unspecified: Secondary | ICD-10-CM | POA: Diagnosis not present

## 2016-06-27 DIAGNOSIS — J449 Chronic obstructive pulmonary disease, unspecified: Secondary | ICD-10-CM | POA: Diagnosis not present

## 2016-06-29 DIAGNOSIS — R0602 Shortness of breath: Secondary | ICD-10-CM | POA: Diagnosis not present

## 2016-06-29 DIAGNOSIS — N289 Disorder of kidney and ureter, unspecified: Secondary | ICD-10-CM | POA: Diagnosis not present

## 2016-06-29 DIAGNOSIS — I739 Peripheral vascular disease, unspecified: Secondary | ICD-10-CM | POA: Diagnosis not present

## 2016-06-29 DIAGNOSIS — E7800 Pure hypercholesterolemia, unspecified: Secondary | ICD-10-CM | POA: Diagnosis not present

## 2016-06-29 DIAGNOSIS — I10 Essential (primary) hypertension: Secondary | ICD-10-CM | POA: Diagnosis not present

## 2016-06-29 DIAGNOSIS — I25118 Atherosclerotic heart disease of native coronary artery with other forms of angina pectoris: Secondary | ICD-10-CM | POA: Diagnosis not present

## 2016-07-14 DIAGNOSIS — J449 Chronic obstructive pulmonary disease, unspecified: Secondary | ICD-10-CM | POA: Diagnosis not present

## 2016-07-20 DIAGNOSIS — Z720 Tobacco use: Secondary | ICD-10-CM | POA: Diagnosis not present

## 2016-07-20 DIAGNOSIS — J449 Chronic obstructive pulmonary disease, unspecified: Secondary | ICD-10-CM | POA: Diagnosis not present

## 2016-07-20 DIAGNOSIS — J441 Chronic obstructive pulmonary disease with (acute) exacerbation: Secondary | ICD-10-CM | POA: Diagnosis not present

## 2016-07-27 DIAGNOSIS — J449 Chronic obstructive pulmonary disease, unspecified: Secondary | ICD-10-CM | POA: Diagnosis not present

## 2016-08-07 DIAGNOSIS — R0602 Shortness of breath: Secondary | ICD-10-CM | POA: Diagnosis not present

## 2016-08-14 DIAGNOSIS — J449 Chronic obstructive pulmonary disease, unspecified: Secondary | ICD-10-CM | POA: Diagnosis not present

## 2016-08-27 DIAGNOSIS — J449 Chronic obstructive pulmonary disease, unspecified: Secondary | ICD-10-CM | POA: Diagnosis not present

## 2016-09-14 DIAGNOSIS — J449 Chronic obstructive pulmonary disease, unspecified: Secondary | ICD-10-CM | POA: Diagnosis not present

## 2016-09-27 DIAGNOSIS — J449 Chronic obstructive pulmonary disease, unspecified: Secondary | ICD-10-CM | POA: Diagnosis not present

## 2016-10-09 DIAGNOSIS — J449 Chronic obstructive pulmonary disease, unspecified: Secondary | ICD-10-CM | POA: Diagnosis not present

## 2016-10-09 DIAGNOSIS — I10 Essential (primary) hypertension: Secondary | ICD-10-CM | POA: Diagnosis not present

## 2016-10-09 DIAGNOSIS — E7800 Pure hypercholesterolemia, unspecified: Secondary | ICD-10-CM | POA: Diagnosis not present

## 2016-10-09 DIAGNOSIS — I739 Peripheral vascular disease, unspecified: Secondary | ICD-10-CM | POA: Diagnosis not present

## 2016-10-09 DIAGNOSIS — I25118 Atherosclerotic heart disease of native coronary artery with other forms of angina pectoris: Secondary | ICD-10-CM | POA: Diagnosis not present

## 2016-10-12 DIAGNOSIS — J449 Chronic obstructive pulmonary disease, unspecified: Secondary | ICD-10-CM | POA: Diagnosis not present

## 2016-10-25 DIAGNOSIS — J449 Chronic obstructive pulmonary disease, unspecified: Secondary | ICD-10-CM | POA: Diagnosis not present

## 2016-11-03 DIAGNOSIS — J449 Chronic obstructive pulmonary disease, unspecified: Secondary | ICD-10-CM | POA: Diagnosis not present

## 2016-11-03 DIAGNOSIS — J9611 Chronic respiratory failure with hypoxia: Secondary | ICD-10-CM | POA: Diagnosis not present

## 2016-11-03 DIAGNOSIS — R0902 Hypoxemia: Secondary | ICD-10-CM | POA: Diagnosis not present

## 2016-11-03 DIAGNOSIS — Z720 Tobacco use: Secondary | ICD-10-CM | POA: Diagnosis not present

## 2016-11-03 DIAGNOSIS — R0609 Other forms of dyspnea: Secondary | ICD-10-CM | POA: Diagnosis not present

## 2016-11-04 DIAGNOSIS — J449 Chronic obstructive pulmonary disease, unspecified: Secondary | ICD-10-CM | POA: Diagnosis not present

## 2016-11-12 DIAGNOSIS — J449 Chronic obstructive pulmonary disease, unspecified: Secondary | ICD-10-CM | POA: Diagnosis not present

## 2016-11-25 DIAGNOSIS — J449 Chronic obstructive pulmonary disease, unspecified: Secondary | ICD-10-CM | POA: Diagnosis not present

## 2016-12-06 ENCOUNTER — Emergency Department
Admission: EM | Admit: 2016-12-06 | Discharge: 2016-12-06 | Disposition: A | Discharge status: Home / Self Care | Payer: Medicare HMO | Attending: Emergency Medicine | Admitting: Emergency Medicine

## 2016-12-06 ENCOUNTER — Encounter: Payer: Self-pay | Admitting: Emergency Medicine

## 2016-12-06 DIAGNOSIS — I10 Essential (primary) hypertension: Secondary | ICD-10-CM | POA: Diagnosis not present

## 2016-12-06 DIAGNOSIS — Z79899 Other long term (current) drug therapy: Secondary | ICD-10-CM | POA: Diagnosis not present

## 2016-12-06 DIAGNOSIS — R0902 Hypoxemia: Secondary | ICD-10-CM | POA: Insufficient documentation

## 2016-12-06 DIAGNOSIS — Z87891 Personal history of nicotine dependence: Secondary | ICD-10-CM | POA: Diagnosis not present

## 2016-12-06 DIAGNOSIS — E119 Type 2 diabetes mellitus without complications: Secondary | ICD-10-CM | POA: Diagnosis not present

## 2016-12-06 DIAGNOSIS — R0602 Shortness of breath: Secondary | ICD-10-CM | POA: Diagnosis not present

## 2016-12-06 DIAGNOSIS — F419 Anxiety disorder, unspecified: Secondary | ICD-10-CM | POA: Diagnosis not present

## 2016-12-06 DIAGNOSIS — J449 Chronic obstructive pulmonary disease, unspecified: Secondary | ICD-10-CM | POA: Diagnosis not present

## 2016-12-06 NOTE — ED Triage Notes (Signed)
Patient was at [employer] and ran out of 02. Always on 2L [place] at home. Was short of breath when ran out of O2 but states she feels fine now.

## 2016-12-06 NOTE — Discharge Instructions (Signed)
You are here because your oxygen condenser failed to function at the restaurant. You have no other complaints and are declining further workup. We do not have the means of giving you  oxygen to get you home where you tell us you have oxygen. We therefore have offered to arrange transport for you but you have declined stating you  would prefer to go with family off oxygen. Obviously there is a risk to this as we have discussed. However, we cannot compel you to change your mind about how you're going to get home. We do advise you if you have any chest pain or shortness of breath on the way home or after or you feel worse in any way that he return to the emergency department. Return if you have any concerns

## 2016-12-06 NOTE — ED Notes (Addendum)
NAD noted at time of D/C. Pt denies questions or concerns. Pt taken to the lobby via wheelchair at this time. Pt refusing further workup, O2 will be provided until patient is in the car with her family, pt states lives 4-5 miles from hospital. This RN will accompany patient to the lobby with the oxygen tank.

## 2016-12-06 NOTE — ED Provider Notes (Addendum)
[facility] Emergency Department Provider Note  ____________________________________________   I have reviewed the triage vital signs and the nursing notes.   HISTORY  Chief Complaint Shortness of Breath    HPI [name] is a 71 y.o. female who has oxygen dependence from COPD. She is on oxygen24 hours a day at 2 L. She ran out of oxygen, became very anxious, and called 911. As soon as she had oxygen back on she felt fine. Patient has no complaints, she had no antecedent symptoms no symptoms consistent symptoms she is satting 96% right now. Patient states she has more than enough oxygen at home and she wants to go there. She does not however have any oxygen in between here and there but she says is a 5 minute trip.      Past Medical History:  Diagnosis Date  . COPD (chronic obstructive pulmonary disease) (HCC)   . Diabetes (HCC)   . Hyperlipidemia   . Hypertension   . Myocardial infarction (HCC)   . Peripheral vascular disease [facility])     Patient Active Problem List   Diagnosis Date Noted  . Hemorrhoid 04/23/2015  . Diabetes (HCC)   . Peripheral vascular disease (HCC)   . Chronic hypoxemic respiratory failure (HCC) 11/07/2014  . COPD, moderate (HCC) 10/04/2014  . Impaired renal function 02/14/2013  . Arteriosclerosis of coronary artery 02/19/2012  . HLD (hyperlipidemia) 02/19/2012  . BP (high blood pressure) 02/19/2012  . Adiposity 02/19/2012  . Angiopathy, peripheral (HCC) 02/19/2012  . Current tobacco use 02/19/2012    Past Surgical History:  Procedure Laterality Date  . CAROTID ENDARTERECTOMY    . CORONARY ARTERY BYPASS GRAFT      Prior to Admission medications   Medication Sig Start Date End Date Taking? Authorizing Provider  acetaminophen (TYLENOL) 500 MG tablet Take 2 tablets by mouth every 6 (six) hours as needed. 07/18/09   Historical Provider, MD  albuterol (PROAIR HFA) 108 (90 BASE) MCG/ACT inhaler Inhale 2 puffs into the lungs  every 6 (six) hours as needed.  10/04/14 10/04/15  Historical Provider, MD  aspirin (ASPIRIN LOW DOSE) 81 MG tablet Take 81 mg by mouth daily.  07/18/09   Historical Provider, MD  atorvastatin (LIPITOR) 80 MG tablet Take 80 mg by mouth daily. 11/15/14   Historical Provider, MD  clopidogrel (PLAVIX) 75 MG tablet TAKE 1 TABLET ONE TIME DAILY 04/02/15   Historical Provider, MD  DULoxetine (CYMBALTA) 60 MG capsule Take 60 mg by mouth daily.  02/07/15   Historical Provider, MD  Fluticasone Furoate-Vilanterol (BREO ELLIPTA) 100-25 MCG/INH AEPB Inhale 1 puff into the lungs daily.  03/25/15   Historical Provider, MD  lisinopril (PRINIVIL,ZESTRIL) 5 MG tablet TAKE 1 TABLET ONE TIME DAILY 04/02/15   Historical Provider, MD  metoprolol (TOPROL-XL) 200 MG 24 hr tablet Take 200 mg by mouth daily.  02/07/15   Historical Provider, MD  tiotropium (SPIRIVA) 18 MCG inhalation capsule Place 18 mcg into inhaler and inhale daily.  10/04/14 10/04/15  Historical Provider, MD  torsemide (DEMADEX) 20 MG tablet TAKE 1 TABLET ONE TIME DAILY 04/02/15   Historical Provider, MD    Allergies Patient has no known allergies.  Family History  Problem Relation Age of Onset  . Diabetes Maternal Grandmother     Social History Social History  Substance Use Topics  . Smoking status: Former Smoker    Packs/day: 2.00    Years: 47.00    Types: Cigarettes    Quit date: 08/11/2011  .  Smokeless tobacco: Never Used  . Alcohol use 0.0 oz/week     Comment: once a year    Review of Systems Constitutional: No fever/chills Eyes: No visual changes. ENT: No sore throat. No stiff neck no neck pain Cardiovascular: Denies chest pain. Respiratory: Denies shortness of breath Since oxygen. Gastrointestinal:   no vomiting.  No diarrhea.  No constipation. Genitourinary: Negative for dysuria. Musculoskeletal: Negative lower extremity swelling Skin: Negative for rash. Neurological: Negative for severe headaches, focal weakness or numbness. 10-point  ROS otherwise negative.  ____________________________________________   PHYSICAL EXAM:  VITAL SIGNS: ED Triage Vitals  Enc Vitals Group     BP 12/06/16 1934 108/63     Pulse Rate 12/06/16 1934 81     Resp 12/06/16 1934 16     Temp --      Temp src --      SpO2 12/06/16 1934 94 %     Weight 12/06/16 1935 200 lb (90.7 kg)     Height 12/06/16 1935  (1.676 m)     Head Circumference --      Peak Flow --      Pain Score --      Pain Loc --      Pain Edu? --      Excl. in GC? --     Constitutional: Alert and oriented. Well appearing and in no acute distress. Eyes: Conjunctivae are normal. PERRL. EOMI. Head: Atraumatic. Nose: No congestion/rhinnorhea. Mouth/Throat: Mucous membranes are moist.  Oropharynx non-erythematous. Neck: No stridor.   Nontender with no meningismus Cardiovascular: Normal rate, regular rhythm. Grossly normal heart sounds.  Good peripheral circulation. Respiratory: Normal respiratory effort.  No retractions. Lungs CTAB. Abdominal: Soft and nontender. No distention. No guarding no rebound Back:  There is no focal tenderness or step off.  there is no midline tenderness there are no lesions noted. there is no CVA tenderness Musculoskeletal: No lower extremity tenderness, no upper extremity tenderness. No joint effusions, no DVT signs strong distal pulses no edema Neurologic:  Normal speech and language. No gross focal neurologic deficits are appreciated.  Skin:  Skin is warm, dry and intact. No rash noted. Psychiatric: Mood and affect are normal. Speech and behavior are normal.  ____________________________________________   LABS (all labs ordered are listed, but only abnormal results are displayed)  Labs Reviewed - No data to display ____________________________________________  EKG  I personally interpreted any EKGs ordered by me or triage  ____________________________________________  RADIOLOGY  I reviewed any imaging ordered by me or triage  that were performed during my shift and, if possible, patient and/or family made aware of any abnormal findings. ____________________________________________   PROCEDURES  Procedure(s) performed: None  Procedures  Critical Care performed: None  ____________________________________________   INITIAL IMPRESSION / ASSESSMENT AND PLAN / ED COURSE  Pertinent labs & imaging results that were available during my care of the patient were reviewed by me and considered in my medical decision making (see chart for details).  Patient in no acute distress refusing all workup E could go home, has a ride to take her there. She does not have oxygen for the short period of time in between here and there. I do not have any way of getting her an oxygen tank to take home with her after discussion with the charge nose. I've offered to arrange transport with oxygen she refuses. Patient understands the risks benefits and alternatives of going home without any workup and without any oxygen here but she is  very comfortable with this. She states it took an hour and a half for her to get in trouble off of her oxygen at the restaurant and she will only be off of it for 5 minutes while being transported home by her family. She is not driving. I think she understands the risks benefits and alternatives of this plan. She understands that she could become hypoxic and even die on the way home if she leaves without oxygen but she is adamant that that is what she intends to do. I don't think I'm in a position to IVC this patient and I think at this point there is nothing else that she will except in terms of impeding this plan. We'll therefore discharge her with close follow-up with her primary care doctor and her pulmonologist she states that she has more than adequate oxygen at home.  ----------------------------------------- 7:55 PM on 12/06/2016 -----------------------------------------  Pt refused to have her  temperature taken "why do I need it?"   ____________________________________________   FINAL CLINICAL IMPRESSION(S) / ED DIAGNOSES  Final diagnoses:  None      This chart was dictated using voice recognition software.  Despite best efforts to proofread,  errors can occur which can change meaning.      Jeanmarie Plant, MD 12/06/16 1953    Jeanmarie Plant, MD 12/06/16 [phone]

## 2016-12-06 NOTE — ED Notes (Addendum)
Pt arrived via EMS and was brought to stat registration in a wheelchair; pt was at [employer] when her O2 tank ran out; sats on arrival of EMS at the restaurant were 81-85%; EMS reports pt was anxious; BP 140/86; NSR on monitor, rate 90; pt says she has oxygen at home and would really just like to go home; convinced pt to stay to see provider;

## 2016-12-12 DIAGNOSIS — J449 Chronic obstructive pulmonary disease, unspecified: Secondary | ICD-10-CM | POA: Diagnosis not present

## 2016-12-17 ENCOUNTER — Emergency Department: Payer: Medicare HMO

## 2016-12-17 ENCOUNTER — Emergency Department
Admission: EM | Admit: 2016-12-17 | Discharge: 2016-12-17 | Disposition: A | Discharge status: Home / Self Care | Payer: Medicare HMO | Attending: Emergency Medicine | Admitting: Emergency Medicine

## 2016-12-17 ENCOUNTER — Encounter: Payer: Self-pay | Admitting: Emergency Medicine

## 2016-12-17 DIAGNOSIS — E119 Type 2 diabetes mellitus without complications: Secondary | ICD-10-CM | POA: Diagnosis not present

## 2016-12-17 DIAGNOSIS — I10 Essential (primary) hypertension: Secondary | ICD-10-CM | POA: Diagnosis not present

## 2016-12-17 DIAGNOSIS — Y939 Activity, unspecified: Secondary | ICD-10-CM | POA: Diagnosis not present

## 2016-12-17 DIAGNOSIS — Z87891 Personal history of nicotine dependence: Secondary | ICD-10-CM | POA: Insufficient documentation

## 2016-12-17 DIAGNOSIS — J449 Chronic obstructive pulmonary disease, unspecified: Secondary | ICD-10-CM | POA: Diagnosis not present

## 2016-12-17 DIAGNOSIS — S5292XA Unspecified fracture of left forearm, initial encounter for closed fracture: Secondary | ICD-10-CM | POA: Diagnosis not present

## 2016-12-17 DIAGNOSIS — I2510 Atherosclerotic heart disease of native coronary artery without angina pectoris: Secondary | ICD-10-CM | POA: Diagnosis not present

## 2016-12-17 DIAGNOSIS — S52592A Other fractures of lower end of left radius, initial encounter for closed fracture: Secondary | ICD-10-CM

## 2016-12-17 DIAGNOSIS — Z7982 Long term (current) use of aspirin: Secondary | ICD-10-CM | POA: Diagnosis not present

## 2016-12-17 DIAGNOSIS — S6992XA Unspecified injury of left wrist, hand and finger(s), initial encounter: Secondary | ICD-10-CM | POA: Diagnosis present

## 2016-12-17 DIAGNOSIS — S52202A Unspecified fracture of shaft of left ulna, initial encounter for closed fracture: Secondary | ICD-10-CM | POA: Diagnosis not present

## 2016-12-17 DIAGNOSIS — S52502A Unspecified fracture of the lower end of left radius, initial encounter for closed fracture: Secondary | ICD-10-CM | POA: Insufficient documentation

## 2016-12-17 DIAGNOSIS — Y92009 Unspecified place in unspecified non-institutional (private) residence as the place of occurrence of the external cause: Secondary | ICD-10-CM | POA: Insufficient documentation

## 2016-12-17 DIAGNOSIS — I252 Old myocardial infarction: Secondary | ICD-10-CM | POA: Diagnosis not present

## 2016-12-17 DIAGNOSIS — Y999 Unspecified external cause status: Secondary | ICD-10-CM | POA: Insufficient documentation

## 2016-12-17 DIAGNOSIS — W010XXA Fall on same level from slipping, tripping and stumbling without subsequent striking against object, initial encounter: Secondary | ICD-10-CM | POA: Diagnosis not present

## 2016-12-17 MED ORDER — HYDROCODONE-ACETAMINOPHEN 5-325 MG PO TABS
1.0000 | ORAL_TABLET | Freq: Once | ORAL | Status: AC
  Administered 2016-12-17: 1 via ORAL
  Filled 2016-12-17: qty 1

## 2016-12-17 MED ORDER — HYDROCODONE-ACETAMINOPHEN 5-325 MG PO TABS: 1.0000 | ORAL_TABLET | ORAL | 0 refills | Status: AC | PRN

## 2016-12-17 NOTE — ED Notes (Signed)

## 2016-12-17 NOTE — ED Notes (Signed)
Pt states falling around 1230 earlier today, injuring LUE wrist; wrist noted to be swollen and patient states pain at 9/10; pt has sensation in the fingers and is able to move all fingers.

## 2016-12-17 NOTE — ED Provider Notes (Addendum)
[facility] Emergency Department Provider Note  ____________________________________________  Time seen: Approximately 3:04 PM  I have reviewed the triage vital signs and the nursing notes.   HISTORY  Chief Complaint Wrist Pain    HPI [name] is a 71 y.o. female who presents emergency department complaining of left wrist pain and deformity status post a fall. Patient reports that she was at home when she tripped over her O2 tubing. Patient states that she fell backwards trying to catch herself an outstretched arm. Patient did not hit her head or lose consciousness. She is reporting severe pain, swelling, deformity to the left wrist. She has no range of motion at this time. She does have good range of motion to all 5 digits of the affected extremity. She denies any loss of sensation to those digits. Patient has not taken any medications prior to arrival. She declines pain medicine at this time. No other injury or complaint at this time.  Patient does have a history of COPD, diabetes, hypertension, MI, peripheral vascular disease. No complaints of these conditions at this time.  Last meal was this morning for breakfast.   Past Medical History:  Diagnosis Date  . COPD (chronic obstructive pulmonary disease) (HCC)   . Diabetes (HCC)   . Hyperlipidemia   . Hypertension   . Myocardial infarction (HCC)   . Peripheral vascular disease [facility](HCC)     Patient Active Problem List   Diagnosis Date Noted  . Hemorrhoid 04/23/2015  . Diabetes (HCC)   . Peripheral vascular disease (HCC)   . Chronic hypoxemic respiratory failure (HCC) 11/07/2014  . COPD, moderate (HCC) 10/04/2014  . Impaired renal function 02/14/2013  . Arteriosclerosis of coronary artery 02/19/2012  . HLD (hyperlipidemia) 02/19/2012  . BP (high blood pressure) 02/19/2012  . Adiposity 02/19/2012  . Angiopathy, peripheral (HCC) 02/19/2012  . Current tobacco use 02/19/2012    Past Surgical  History:  Procedure Laterality Date  . CAROTID ENDARTERECTOMY    . CORONARY ARTERY BYPASS GRAFT      Prior to Admission medications   Medication Sig Start Date End Date Taking? Authorizing Provider  acetaminophen (TYLENOL) 500 MG tablet Take 2 tablets by mouth every 6 (six) hours as needed. 07/18/09   [provider]  albuterol (PROAIR HFA) 108 (90 BASE) MCG/ACT inhaler Inhale 2 puffs into the lungs every 6 (six) hours as needed.  10/04/14 10/04/15  [provider]  aspirin (ASPIRIN LOW DOSE) 81 MG tablet Take 81 mg by mouth daily.  07/18/09   [provider]  atorvastatin (LIPITOR) 80 MG tablet Take 80 mg by mouth daily. 11/15/14   [provider]  clopidogrel (PLAVIX) 75 MG tablet TAKE 1 TABLET ONE TIME DAILY 04/02/15   [provider]  DULoxetine (CYMBALTA) 60 MG capsule Take 60 mg by mouth daily.  02/07/15   [provider]  Fluticasone Furoate-Vilanterol (BREO ELLIPTA) 100-25 MCG/INH AEPB Inhale 1 puff into the lungs daily.  03/25/15   [provider]  HYDROcodone-acetaminophen (NORCO/VICODIN) 5-325 MG tablet Take 1 tablet by mouth every 4 (four) hours as needed for moderate pain. 12/17/16   Cuthriell, Delorise RoyalsJonathan D, PA-C  lisinopril (PRINIVIL,ZESTRIL) 5 MG tablet TAKE 1 TABLET ONE TIME DAILY 04/02/15   [provider]  metoprolol (TOPROL-XL) 200 MG 24 hr tablet Take 200 mg by mouth daily.  02/07/15   [provider]  tiotropium (SPIRIVA) 18 MCG inhalation capsule Place 18 mcg into inhaler and inhale daily.  10/04/14 10/04/15  [provider]  torsemide (DEMADEX) 20 MG tablet TAKE 1 TABLET ONE TIME DAILY 04/02/15   [provider]    Allergies Patient has no known allergies.  Family History  Problem Relation Age of Onset  . Diabetes Maternal Grandmother     Social History Social History  Substance Use Topics  . Smoking status: Former Smoker    Packs/day: 2.00    Years: 47.00    Types: Cigarettes     Quit date: 08/11/2011  . Smokeless tobacco: Never Used  . Alcohol use 0.0 oz/week     Comment: once a year     Review of Systems  Constitutional: No fever/chills Eyes: No visual changes.  Cardiovascular: no chest pain. Respiratory: no cough. No SOB. Gastrointestinal: No abdominal pain.  No nausea, no vomiting.   Musculoskeletal: positive for left wrist pain, swelling, deformity. Skin: Negative for rash, abrasions, lacerations, ecchymosis. Neurological: Negative for headaches, focal weakness or numbness. 10-point ROS otherwise negative.  ____________________________________________   PHYSICAL EXAM:  VITAL SIGNS: ED Triage Vitals  Enc Vitals Group     BP 12/17/16 1425 (!) 143/94     Pulse Rate 12/17/16 1425 72     Resp 12/17/16 1425 20     Temp 12/17/16 1425 98.9 F (37.2 C)     Temp Source 12/17/16 1425 Oral     SpO2 12/17/16 1425 96 %     Weight 12/17/16 1426 200 lb (90.7 kg)     Height 12/17/16 1426 5\' 6"  (1.676 m)     Head Circumference --      Peak Flow --      Pain Score 12/17/16 1425 9     Pain Loc --      Pain Edu? --      Excl. in GC? --      Constitutional: Alert and oriented. Well appearing and in no acute distress. Eyes: Conjunctivae are normal. PERRL. EOMI. Head: Atraumatic. Neck: No stridor.    Cardiovascular: Normal rate, regular rhythm. Normal S1 and S2.  Good peripheral circulation. Respiratory: Normal respiratory effort without tachypnea or retractions. Lungs diffuse scattered coarse breath sounds and occasional wheeze.no rales or rhonchi. Good air entry to the bases with no decreased or absent breath sounds. Musculoskeletal: Limited range of motion to the left upper extremity. Visible deformity to the distal radius and ulna region. Significant edema in same region. Patient has no range of motion of the wrist. Full range of motion all 5 digits left hand. Sensation and cap refill intact all digits left hand. Patient does have a good radial pulse.  Extreme tenderness to palpation over the distal radius and ulna. Palpable abnormality to the distal radius. Examination of the left elbow is unremarkable. Neurologic:  Normal speech and language. No gross focal neurologic deficits are appreciated.  Skin:  Skin is warm, dry and intact. No rash noted. Psychiatric: Mood and affect are normal. Speech and behavior are normal. Patient exhibits appropriate insight and judgement.   ____________________________________________   LABS (all labs ordered are listed, but only abnormal results are displayed)  Labs Reviewed - No data to display ____________________________________________  EKG   ____________________________________________  RADIOLOGY Festus Barren Cuthriell, personally viewed and evaluated these images (plain radiographs) as part of my medical decision making, as well as reviewing the written report by the radiologist.  Dg Wrist Complete Left  Result Date: 12/17/2016 CLINICAL DATA:  Wrist pain and deformity after a fall at home this morning. EXAM: LEFT WRIST - COMPLETE 3+  VIEW COMPARISON:  None in PACs FINDINGS: The patient has sustained an acute comminuted angulated fracture of the distal left radial metaphysis. There is an ulnar styloid fracture of uncertain age. The carpal bones appear intact. There is moderate degenerative change of the first [facility] joint. IMPRESSION: There is an acute angulated comminuted fracture of the distal left radial metaphysis. There is an ulnar styloid fracture of uncertain age. Moderate degenerative change of the first [facility] joint. Electronically Signed   By: David  [place] M.D.   On: 12/17/2016 15:06    ____________________________________________    PROCEDURES  Procedure(s) performed:    .Splint Application Date/Time: 12/17/2016 10:14 PM Performed by: Gala Romney D Authorized by: Gala Romney D   Consent:    Consent obtained:  Verbal   Consent given by:  Patient   Risks discussed:   Pain and swelling Pre-procedure details:    Sensation:  Normal Procedure details:    Laterality:  Left   Location:  Wrist   Wrist:  L wrist   Splint type:  Sugar tong   Supplies:  Cotton padding, Ortho-Glass, elastic bandage and sling Post-procedure details:    Pain:  Unchanged   Sensation:  Normal   Patient tolerance of procedure:  Tolerated well, no immediate complications      Medications  HYDROcodone-acetaminophen (NORCO/VICODIN) 5-325 MG per tablet 1 tablet (1 tablet Oral Given 12/17/16 1555)     ____________________________________________   INITIAL IMPRESSION / ASSESSMENT AND PLAN / ED COURSE  Pertinent labs & imaging results that were available during my care of the patient were reviewed by me and considered in my medical decision making (see chart for details).  Review of the [place] CSRS was performed in accordance of the [facility] prior to dispensing any controlled drugs.     Patient's diagnosis is consistent with distal left radius fracture. This is an acute, angulated, comminuted distal radius fracture with ulnar styloid fracture. Due to significant findings on x-ray, I discussed the case with on-call orthopedic surgeon, Dr. Ernest Pine. At this time, patient does have a significant medical history of COPD with constant O2 use. Dr. Ernest Pine advises that this will likely require surgical fixation but due to significant medical history, patient should be cleared by pulmonology and primary care prior to surgery. At this time, he recommends sugar tong splint and close follow-up with orthopedics.. Patient will be discharged home with prescriptions for Norco for pain relief. Patient is to follow up with orthopedics for further evaluation and management of this condition.. Patient is given ED precautions to return to the ED for any worsening or new symptoms.     ____________________________________________  FINAL CLINICAL IMPRESSION(S) / ED DIAGNOSES  Final diagnoses:  Other closed  fracture of distal end of left radius, initial encounter      NEW MEDICATIONS STARTED DURING THIS VISIT:  New Prescriptions   HYDROCODONE-ACETAMINOPHEN (NORCO/VICODIN) 5-325 MG TABLET    Take 1 tablet by mouth every 4 (four) hours as needed for moderate pain.        This chart was dictated using voice recognition software/Dragon. Despite best efforts to proofread, errors can occur which can change the meaning. Any change was purely unintentional.    Racheal Patches, PA-C 12/17/16 1601    Phineas Semen, MD 12/17/16 1611    Alm Bustard, Delorise Royals, PA-C 12/17/16 2214    Phineas Semen, MD 12/17/16 [phone]

## 2016-12-17 NOTE — ED Triage Notes (Signed)
Pt c/o left wrist pain after tripping over oxygen tubing. Deformity to left wrist.  Radial pulse palpable. Also c/o mild left hip pain but was able to ambulate after fall.

## 2016-12-21 DIAGNOSIS — S52352A Displaced comminuted fracture of shaft of radius, left arm, initial encounter for closed fracture: Secondary | ICD-10-CM | POA: Diagnosis not present

## 2016-12-25 DIAGNOSIS — J449 Chronic obstructive pulmonary disease, unspecified: Secondary | ICD-10-CM | POA: Diagnosis not present

## 2016-12-28 ENCOUNTER — Encounter
Admission: RE | Admit: 2016-12-28 | Discharge: 2016-12-28 | Disposition: A | Discharge status: Home / Self Care | Payer: Medicare HMO | Source: Ambulatory Visit | Attending: Orthopedic Surgery | Admitting: Orthopedic Surgery

## 2016-12-28 DIAGNOSIS — Z01810 Encounter for preprocedural cardiovascular examination: Secondary | ICD-10-CM | POA: Diagnosis not present

## 2016-12-28 DIAGNOSIS — Z01812 Encounter for preprocedural laboratory examination: Secondary | ICD-10-CM | POA: Insufficient documentation

## 2016-12-28 DIAGNOSIS — I4891 Unspecified atrial fibrillation: Secondary | ICD-10-CM | POA: Insufficient documentation

## 2016-12-28 DIAGNOSIS — I252 Old myocardial infarction: Secondary | ICD-10-CM | POA: Diagnosis not present

## 2016-12-28 HISTORY — DX: Dependence on supplemental oxygen: Z99.81

## 2016-12-28 HISTORY — DX: Atherosclerotic heart disease of native coronary artery without angina pectoris: I25.10

## 2016-12-28 HISTORY — DX: Diverticulosis of intestine, part unspecified, without perforation or abscess without bleeding: K57.90

## 2016-12-28 HISTORY — DX: Anxiety disorder, unspecified: F41.9

## 2016-12-28 LAB — CBC
HEMATOCRIT: 27.6 % — AB (ref 35.0–47.0)
HEMOGLOBIN: 8.6 g/dL — AB (ref 12.0–16.0)
MCH: 24.3 pg — AB (ref 26.0–34.0)
MCHC: 31.2 g/dL — AB (ref 32.0–36.0)
MCV: 77.7 fL — AB (ref 80.0–100.0)
Platelets: 266 10*3/uL (ref 150–440)
RBC: 3.55 MIL/uL — ABNORMAL LOW (ref 3.80–5.20)
RDW: 21 % — AB (ref 11.5–14.5)
WBC: 8.6 10*3/uL (ref 3.6–11.0)

## 2016-12-28 LAB — BASIC METABOLIC PANEL
ANION GAP: 11 (ref 5–15)
BUN: 21 mg/dL — AB (ref 6–20)
CALCIUM: 9.2 mg/dL (ref 8.9–10.3)
CO2: 38 mmol/L — AB (ref 22–32)
Chloride: 90 mmol/L — ABNORMAL LOW (ref 101–111)
Creatinine, Ser: 1.24 mg/dL — ABNORMAL HIGH (ref 0.44–1.00)
GFR calc Af Amer: 50 mL/min — ABNORMAL LOW (ref >60)
GFR calc non Af Amer: 43 mL/min — ABNORMAL LOW (ref >60)
GLUCOSE: 160 mg/dL — AB (ref 65–99)
Potassium: 3 mmol/L — ABNORMAL LOW (ref 3.5–5.1)
Sodium: 139 mmol/L (ref 135–145)

## 2016-12-28 LAB — DIFFERENTIAL
BASOS ABS: 0 10*3/uL (ref 0–0.1)
BASOS PCT: 1 %
EOS ABS: 0 10*3/uL (ref 0–0.7)
Eosinophils Relative: 0 %
LYMPHS ABS: 0.8 10*3/uL — AB (ref 1.0–3.6)
Lymphocytes Relative: 9 %
MONO ABS: 0.8 10*3/uL (ref 0.2–0.9)
MONOS PCT: 9 %
Neutro Abs: 6.9 10*3/uL — ABNORMAL HIGH (ref 1.4–6.5)
Neutrophils Relative %: 81 %

## 2016-12-28 NOTE — Patient Instructions (Signed)
Your procedure is scheduled on: Jan 01, 2017 (Friday) Report to Same Day Surgery 2nd floor medical mall [facility](Medical Mall Entrance-take elevator on left to 2nd floor.  Check in with surgery information desk.) To find out your arrival time please call [phone] between 1PM - 3PM on Dec 31, 2016 (Thursday)   Remember: Instructions that are not followed completely may result in serious medical risk, up to and including death, or upon the discretion of your surgeon and anesthesiologist your surgery may need to be rescheduled.    _x___ 1. Do not eat food or drink liquids after midnight. No gum chewing or hard candies                          __x__ 2. No Alcohol for 24 hours before or after surgery.   __x__3. No Smoking for 24 prior to surgery.   ____  4. Bring all medications with you on the day of surgery if instructed.    __x__ 5. Notify your doctor if there is any change in your medical condition     (cold, fever, infections).     Do not wear jewelry, make-up, hairpins, clips or nail polish.  Do not wear lotions, powders, or perfumes. You may wear deodorant.  Do not shave 48 hours prior to surgery. Men may shave face and neck.  Do not bring valuables to the hospital.    [facility] is [facility] responsible [facility] any belongings or valuables.               Contacts, dentures or bridgework may not be worn into surgery.  Leave your suitcase in the car. After surgery it may be brought to your room.  For patients admitted to the hospital, discharge time is determined by your  treatment team                     Patients discharged the day of surgery will not be allowed to drive home.  You will need someone to drive you home and stay with you the night of your procedure.    Please read over the following fact sheets that you were given:   [facility] Preparing for [facility] and or MRSA Information   _x___ Take anti-hypertensive (unless it includes a diuretic), cardiac, seizure, asthma,      anti-reflux and psychiatric medicines with a sip of water.. These include:  1. METOPROLOL  2. DULOXETINE  3.  4.  5.  6.  ____Fleets enema or Magnesium Citrate as directed.   _x___ Use CHG Soap or sage wipes as directed on instruction sheet   _x__ Use inhalers on the day of surgery and bring to hospital day of surgery (USE BUDESONIDE AND IPRATROPIUM   BROMIDE NEBULIZER THE MORNING OF SURGERY AND BRING ALBUTEROL INHALER TO HOSPITAL)                                     ____ Stop Metformin and Janumet 2 days prior to surgery.    ____ Take 1/2 of usual insulin dose the night before surgery and none on the morning     surgery.   _x___ Follow recommendations from Cardiologist, Pulmonologist or PCP regarding          stopping Aspirin, Coumadin, Pllavix ,Eliquis, Effient, or Pradaxa, and Pletal. (PATIENT STATED STOPPED ASPIRIN AND PLAVIX ON MAY 18--Friday)  X____Stop Anti-inflammatories such as Advil, Aleve, Ibuprofen, Motrin, Naproxen, Naprosyn, Goodies powders or aspirin products. OK to take Tylenol or pain medication if needed.   _x___ Stop supplements until after surgery.  But may continue Vitamin D, Vitamin B,  And multivitamin,      ____ Bring C-Pap to the hospital.

## 2016-12-28 NOTE — Pre-Procedure Instructions (Signed)
Clearance note and Pre-op EKG faxed to Dr. Ernest PineHooten office and [facility] Dr. Lady GaryFath to compare pre-op EKG with previous EKG done November 2017.

## 2016-12-28 NOTE — Pre-Procedure Instructions (Signed)
Component Name Value Ref Range  Vent Rate (bpm) 81   QRS Interval (msec) 84   QT Interval (msec) 466   QTc (msec) 541   Result Narrative  Probable nsr.      I reviewed and concur with this report. Electronically signed ZO:XWRUby:FATH MD, KEN ([id number]) on 07/14/2016 10:49:50 AM  Status Results Details

## 2016-12-28 NOTE — Pre-Procedure Instructions (Signed)
Patient medical history and pre-op EKG called to Dr. Pernell DupreAdams and requested Dr. Lady GaryFath to compare pre-op EKG with previous EKG for changes.

## 2016-12-29 ENCOUNTER — Encounter: Payer: Self-pay | Admitting: Anesthesiology

## 2016-12-29 NOTE — Pre-Procedure Instructions (Signed)
CBC and Met B sent to Dr Marry Guan and Anesthesia for review.

## 2016-12-29 NOTE — Progress Notes (Signed)
Discussed case with Dr. Ernest PineHooten. Patient with wrist fracture and significant comorbidities. Has been cleared for surgery by her cardiologist. She does have severe pulmonary disease, uses O2 at home, and her pulmonologist recommended the case be done under local rather than general anesthesia. We discussed that this could be accomplished using a regional nerve block with minimal IV sedation during the procedure.

## 2016-12-29 NOTE — Pre-Procedure Instructions (Signed)
SPOKE WITH DR Priscella MannPENWARDEN AND SHE VERIFIED DISCUSSION WITH DR HOOTEN THAT PATIENT SHOULD NOT HAVE GENERAL. ALSO GETTING TELEPHONE NOTE FROM 12/23/16 DR Meredeth IdeFLEMING [facility] NOTES PATIENT SHOULD BE DONE UNDER LOCAL NOT GENERAL.

## 2016-12-30 DIAGNOSIS — E7800 Pure hypercholesterolemia, unspecified: Secondary | ICD-10-CM | POA: Diagnosis not present

## 2016-12-30 DIAGNOSIS — Z01818 Encounter for other preprocedural examination: Secondary | ICD-10-CM | POA: Diagnosis not present

## 2016-12-30 DIAGNOSIS — I25118 Atherosclerotic heart disease of native coronary artery with other forms of angina pectoris: Secondary | ICD-10-CM | POA: Diagnosis not present

## 2016-12-30 DIAGNOSIS — I10 Essential (primary) hypertension: Secondary | ICD-10-CM | POA: Diagnosis not present

## 2016-12-30 DIAGNOSIS — I739 Peripheral vascular disease, unspecified: Secondary | ICD-10-CM | POA: Diagnosis not present

## 2016-12-30 DIAGNOSIS — R079 Chest pain, unspecified: Secondary | ICD-10-CM | POA: Diagnosis not present

## 2016-12-30 NOTE — Pre-Procedure Instructions (Signed)
SPOKE WITH ELAINE AT DR Elenor LegatoHOOTEN'S AND PATIENT [facility]

## 2016-12-31 DIAGNOSIS — E876 Hypokalemia: Secondary | ICD-10-CM | POA: Diagnosis not present

## 2016-12-31 DIAGNOSIS — R079 Chest pain, unspecified: Secondary | ICD-10-CM | POA: Diagnosis not present

## 2017-01-01 ENCOUNTER — Encounter: Admission: RE | Payer: Self-pay | Source: Ambulatory Visit

## 2017-01-01 ENCOUNTER — Ambulatory Visit: Admission: RE | Admit: 2017-01-01 | Payer: Medicare HMO | Source: Ambulatory Visit | Admitting: Orthopedic Surgery

## 2017-01-01 SURGERY — OPEN REDUCTION INTERNAL FIXATION (ORIF) DISTAL RADIUS FRACTURE
Anesthesia: Choice | Laterality: Left

## 2017-01-05 ENCOUNTER — Other Ambulatory Visit: Payer: Self-pay | Admitting: Cardiology

## 2017-01-05 DIAGNOSIS — R079 Chest pain, unspecified: Secondary | ICD-10-CM | POA: Diagnosis not present

## 2017-01-05 DIAGNOSIS — I10 Essential (primary) hypertension: Secondary | ICD-10-CM | POA: Diagnosis not present

## 2017-01-05 DIAGNOSIS — I25118 Atherosclerotic heart disease of native coronary artery with other forms of angina pectoris: Secondary | ICD-10-CM | POA: Diagnosis not present

## 2017-01-05 DIAGNOSIS — I739 Peripheral vascular disease, unspecified: Secondary | ICD-10-CM | POA: Diagnosis not present

## 2017-01-05 DIAGNOSIS — E7800 Pure hypercholesterolemia, unspecified: Secondary | ICD-10-CM | POA: Diagnosis not present

## 2017-01-06 ENCOUNTER — Encounter
Admission: RE | Disposition: A | Discharge status: Home / Self Care | Payer: Self-pay | Source: Ambulatory Visit | Attending: Cardiology

## 2017-01-06 ENCOUNTER — Ambulatory Visit
Admission: RE | Admit: 2017-01-06 | Discharge: 2017-01-06 | Disposition: A | Discharge status: Home / Self Care | Payer: Medicare HMO | Source: Ambulatory Visit | Attending: Cardiology | Admitting: Cardiology

## 2017-01-06 DIAGNOSIS — Z87891 Personal history of nicotine dependence: Secondary | ICD-10-CM | POA: Diagnosis not present

## 2017-01-06 DIAGNOSIS — R0789 Other chest pain: Secondary | ICD-10-CM | POA: Diagnosis not present

## 2017-01-06 DIAGNOSIS — I10 Essential (primary) hypertension: Secondary | ICD-10-CM | POA: Insufficient documentation

## 2017-01-06 DIAGNOSIS — I129 Hypertensive chronic kidney disease with stage 1 through stage 4 chronic kidney disease, or unspecified chronic kidney disease: Secondary | ICD-10-CM | POA: Insufficient documentation

## 2017-01-06 DIAGNOSIS — E7800 Pure hypercholesterolemia, unspecified: Secondary | ICD-10-CM | POA: Diagnosis not present

## 2017-01-06 DIAGNOSIS — Z7982 Long term (current) use of aspirin: Secondary | ICD-10-CM | POA: Diagnosis not present

## 2017-01-06 DIAGNOSIS — Z7902 Long term (current) use of antithrombotics/antiplatelets: Secondary | ICD-10-CM | POA: Diagnosis not present

## 2017-01-06 DIAGNOSIS — E669 Obesity, unspecified: Secondary | ICD-10-CM | POA: Diagnosis not present

## 2017-01-06 DIAGNOSIS — I25719 Atherosclerosis of autologous vein coronary artery bypass graft(s) with unspecified angina pectoris: Secondary | ICD-10-CM | POA: Diagnosis not present

## 2017-01-06 DIAGNOSIS — E1151 Type 2 diabetes mellitus with diabetic peripheral angiopathy without gangrene: Secondary | ICD-10-CM | POA: Diagnosis not present

## 2017-01-06 DIAGNOSIS — I509 Heart failure, unspecified: Secondary | ICD-10-CM | POA: Diagnosis not present

## 2017-01-06 DIAGNOSIS — I25118 Atherosclerotic heart disease of native coronary artery with other forms of angina pectoris: Secondary | ICD-10-CM | POA: Diagnosis not present

## 2017-01-06 DIAGNOSIS — N289 Disorder of kidney and ureter, unspecified: Secondary | ICD-10-CM | POA: Diagnosis not present

## 2017-01-06 DIAGNOSIS — J449 Chronic obstructive pulmonary disease, unspecified: Secondary | ICD-10-CM | POA: Diagnosis not present

## 2017-01-06 DIAGNOSIS — I252 Old myocardial infarction: Secondary | ICD-10-CM | POA: Diagnosis not present

## 2017-01-06 HISTORY — PX: LEFT HEART CATH AND CORONARY ANGIOGRAPHY: CATH118249

## 2017-01-06 SURGERY — LEFT HEART CATH AND CORONARY ANGIOGRAPHY
Anesthesia: Moderate Sedation

## 2017-01-06 SURGERY — LEFT HEART CATH
Anesthesia: Moderate Sedation

## 2017-01-06 MED ORDER — FENTANYL CITRATE (PF) 100 MCG/2ML IJ SOLN
INTRAMUSCULAR | Status: DC | PRN
  Administered 2017-01-06: 25 ug via INTRAVENOUS

## 2017-01-06 MED ORDER — MIDAZOLAM HCL 2 MG/2ML IJ SOLN
INTRAMUSCULAR | Status: AC
  Filled 2017-01-06: qty 2

## 2017-01-06 MED ORDER — SODIUM CHLORIDE 0.9 % IV SOLN: 250.0000 mL | INTRAVENOUS | Status: DC | PRN

## 2017-01-06 MED ORDER — ASPIRIN 81 MG PO CHEW
CHEWABLE_TABLET | ORAL | Status: AC
  Filled 2017-01-06: qty 1

## 2017-01-06 MED ORDER — SODIUM CHLORIDE 0.9 % IV SOLN
INTRAVENOUS | Status: DC
  Administered 2017-01-06 (×2): via INTRAVENOUS

## 2017-01-06 MED ORDER — SODIUM CHLORIDE 0.9% FLUSH: 3.0000 mL | INTRAVENOUS | Status: DC | PRN

## 2017-01-06 MED ORDER — SODIUM CHLORIDE 0.9% FLUSH: 3.0000 mL | Freq: Two times a day (BID) | INTRAVENOUS | Status: DC

## 2017-01-06 MED ORDER — SODIUM CHLORIDE 0.9 % WEIGHT BASED INFUSION: 1.0000 mL/kg/h | INTRAVENOUS | Status: DC

## 2017-01-06 MED ORDER — SODIUM CHLORIDE 0.9 % WEIGHT BASED INFUSION
3.0000 mL/kg/h | INTRAVENOUS | Status: DC
  Administered 2017-01-06: 3 mL/kg/h via INTRAVENOUS

## 2017-01-06 MED ORDER — HEPARIN (PORCINE) IN NACL 2-0.9 UNIT/ML-% IJ SOLN
INTRAMUSCULAR | Status: AC
  Filled 2017-01-06: qty 1000

## 2017-01-06 MED ORDER — IOPAMIDOL (ISOVUE-300) INJECTION 61%
INTRAVENOUS | Status: DC | PRN
  Administered 2017-01-06: 130 mL via INTRA_ARTERIAL

## 2017-01-06 MED ORDER — FENTANYL CITRATE (PF) 100 MCG/2ML IJ SOLN
INTRAMUSCULAR | Status: AC
  Filled 2017-01-06: qty 2

## 2017-01-06 MED ORDER — MIDAZOLAM HCL 2 MG/2ML IJ SOLN
INTRAMUSCULAR | Status: DC | PRN
  Administered 2017-01-06: 1 mg via INTRAVENOUS

## 2017-01-06 MED ORDER — ASPIRIN 81 MG PO CHEW
81.0000 mg | CHEWABLE_TABLET | ORAL | Status: AC
  Administered 2017-01-06: 81 mg via ORAL

## 2017-01-06 SURGICAL SUPPLY — 11 items
CATH INFINITI 5 FR IM (CATHETERS) ×3 IMPLANT
CATH INFINITI 5FR ANG PIGTAIL (CATHETERS) ×3 IMPLANT
CATH INFINITI 5FR JL4 (CATHETERS) ×3 IMPLANT
CATH INFINITI JR4 5F (CATHETERS) ×3 IMPLANT
DEVICE CLOSURE MYNXGRIP 5F (Vascular Products) ×3 IMPLANT
KIT MANI 3VAL PERCEP (MISCELLANEOUS) ×3 IMPLANT
NEEDLE PERC 18GX7CM (NEEDLE) ×3 IMPLANT
PACK CARDIAC CATH (CUSTOM PROCEDURE TRAY) ×3 IMPLANT
SHEATH AVANTI 5FR X 11CM (SHEATH) ×3 IMPLANT
WIRE EMERALD 3MM-J .035X150CM (WIRE) ×3 IMPLANT
WIRE EMERALD 3MM-J .035X260CM (WIRE) ×3 IMPLANT

## 2017-01-06 NOTE — Discharge Instructions (Signed)
Groin Insertion Instructions-If you lose feeling or develop tingling or pain in your leg or foot after the procedure, please walk around first.  If the discomfort does not improve , contact your physician and proceed to the nearest emergency room.  Loss of feeling in your leg might mean that a blockage has formed in the artery and this can be appropriately treated.  Limit your activity for the next two days after your procedure.  Avoid stooping, bending, heavy lifting or exertion as this may put pressure on the insertion site.  Resume normal activities in 48 hours.  You may shower after 24 hours but avoid excessive warm water and do not scrub the site.  Remove clear dressing in 48 hours.  If you have had a closure device inserted, do not soak in a tub bath or a hot tub for at least one week. ° °No driving for 48 hours after discharge.  After the procedure, check the insertion site occasionally.  If any oozing occurs or there is apparent swelling, firm pressure over the site will prevent a bruise from forming.  You can not hurt anything by pressing directly on the site.  The pressure stops the bleeding by allowing a small clot to form.  If the bleeding continues after the pressure has been applied for more than 15 minutes, call 911 or go to the nearest emergency room.   ° °The x-ray dye causes you to pass a considerate amount of urine.  For this reason, you will be asked to drink plenty of liquids after the procedure to prevent dehydration.  You may resume you regular diet.  Avoid caffeine products.   ° °For pain at the site of your procedure, take non-aspirin medicines such as Tylenol. ° °Medications: A. Hold Metformin for 48 hours if applicable.  B. Continue taking all your present medications at home unless your doctor prescribes any changes. ° °Moderate Conscious Sedation, Adult, Care After °These instructions provide you with information about caring for yourself after your procedure. Your health care provider  may also give you more specific instructions. Your treatment has been planned according to current medical practices, but problems sometimes occur. Call your health care provider if you have any problems or questions after your procedure. °What can I expect after the procedure? °After your procedure, it is common: °· To feel sleepy for several hours. °· To feel clumsy and have poor balance for several hours. °· To have poor judgment for several hours. °· To vomit if you eat too soon. °Follow these instructions at home: °For at least 24 hours after the procedure:  ° °· Do not: °¨ Participate in activities where you could fall or become injured. °¨ Drive. °¨ Use heavy machinery. °¨ Drink alcohol. °¨ Take sleeping pills or medicines that cause drowsiness. °¨ Make important decisions or sign legal documents. °¨ Take care of children on your own. °· Rest. °Eating and drinking  °· Follow the diet recommended by your health care provider. °· If you vomit: °¨ Drink water, juice, or soup when you can drink without vomiting. °¨ Make sure you have little or no nausea before eating solid foods. °General instructions  °· Have a responsible adult stay with you until you are awake and alert. °· Take over-the-counter and prescription medicines only as told by your health care provider. °· If you smoke, do not smoke without supervision. °· Keep all follow-up visits as told by your health care provider. This is important. °Contact a health   care provider if: °· You keep feeling nauseous or you keep vomiting. °· You feel light-headed. °· You develop a rash. °· You have a fever. °Get help right away if: °· You have trouble breathing. °This information is not intended to replace advice given to you by your health care provider. Make sure you discuss any questions you have with your health care provider. °Document Released: 05/17/2013 Document Revised: 12/30/2015 Document Reviewed: 11/16/2015 °Elsevier Interactive Patient Education © 2017  [employer]vier Inc. ° °

## 2017-01-06 NOTE — H&P (Addendum)
Chief Complaint: Chief Complaint  Patient presents with  . Follow-up  abnormal stress test done for pre op clearance /Dr Hooten  . Shortness of Breath  its worse with the pain  . Edema  left does go down at nite but the right one doesnot  . Fatigue  more so  Date of Service: 01/05/2017 Date of Birth: August 04, 1946 PCP: Lynnea Ferrier, MD  History of Present Illness: Ms. Panozzo is a 71 y.o.female patient who presents for follow-up visit. Patient has a history of COPD treated with bronchodilators. She quit smoking in 2013. History of coronary artery disease status post coronary artery bypass grafting in 1991. She had an ST elevation myocardial infarction complicated by shock in 2010 and had a bare-metal stent placed in the mid circumflex. Cardiac cath in 2011 revealed normal left main, total prox LAD, total mid left circumflex, 70% OM2, total prox RCA, LIMA to the LAD patent, two saphenous vein grafts OM2 and PDA with total, mid circumflex lesion open acutely with a 2.5 x 12 mm. mini-Vision bare metal stent placed.  She also has peripheral vascular disease and is status post percutaneous intervention to her left common iliac vessel.. She has an ejection fraction of 35% by echo in 2012 with mild MR mild TR trivial TR. She also carries a diagnosis of diabetes mellitus, hypertension, hyperlipidemia and chronic kidney disease. She has been treated with aspirin and clopidogrel for dual antiplatelet therapy as well as lisinopril and metoprolol. She underwent an echocardiogram which revealed ejection fraction of 40% with moderate MR and moderate TR. Her breathing has remained stable. Prior to consideration for her shoulder surgery, she underwent a functional study which showed reduced LV function and evidence of anteroseptal ischemia. Surgery was canceled. She does have dyspnea on exertion. She has no chest pain. Will need invasive evaluation with cardiac cath to evaluate her coronary anatomy prior to  semielective surgery. Risks and benefits of left heart cath were explained to the patient she agrees to proceed. Past Medical and Surgical History  Past Medical History Past Medical History:  Diagnosis Date  . Carotid artery occlusion  . Chicken pox  . Coronary artery disease  . Diabetes mellitus (CMS-HCC) 02/19/2012  . Diabetes mellitus type 2, uncomplicated (CMS-HCC)  . Hemorrhoids  . Hyperlipidemia, unspecified  . Hypertension  . Myocardial infarction (CMS-HCC)  . Obesity, unspecified 02/19/2012  . PAD (peripheral artery disease) (CMS-HCC) 02/19/2012  03/2010: PTA / stenting of 90% left CIA stenosis for LLE Claudication and ABI of 0.49, R. ABI was normal  . Peripheral vascular disease (CMS-HCC)  . Tobacco abuse 02/19/2012  - utilizing electronic cigarettes   Past Surgical History She has a past surgical history that includes Cardiac catheterization; Hemorrhoidectomy By Simple Ligation; Carotid endarterectomy (Left, 2002); Tonsillectomy; Colonoscopy (03/2007); Coronary artery bypass graft (1991); CORONARY ARTERY BYPASS GRAFTING AT [facility] (1992); and Hysterectomy.   Medications and Allergies  Current Medications  Current Outpatient Prescriptions  Medication Sig Dispense Refill  . acetaminophen 650 mg Tab 1 tab by mouth every 4 hours as needed  . aspirin 81 mg tablet 1 tab by mouth daily  . atorvastatin (LIPITOR) 80 MG tablet TAKE 1 TABLET ONE TIME DAILY 90 tablet 1  . budesonide (PULMICORT) 0.5 mg/2 mL nebulizer solution Take 2 mLs (0.5 mg total) by nebulization 2 (two) times daily. 360 mL 3  . clopidogrel (PLAVIX) 75 mg tablet TAKE 1 TABLET EVERY DAY 90 tablet 3  . DULoxetine (CYMBALTA) 60 MG DR capsule TAKE 1 CAPSULE  EVERY DAY 90 capsule 3  . HYDROcodone-acetaminophen (NORCO) 5-325 mg tablet Take 1-2 tablets by mouth every 4 (four) hours as needed for Pain. 60 tablet 0  . ipratropium-albuterol (DUO-NEB) nebulizer solution Inhale into the lungs.  . metoprolol succinate (TOPROL-XL) 200  MG XL tablet TAKE 1 TABLET EVERY DAY 90 tablet 3  . TORsemide (DEMADEX) 20 MG tablet TAKE 1 TABLET EVERY DAY 90 tablet 3  . alcohol swabs PadM Apply 1 Swab topically 2 (two) times daily. DX CODE E11.9 180 each 3  . blood glucose control, low (TRUE METRIX LEVEL 1) Soln Use 1 Bottle as directed. DX CODE E11.9 1 each 3  . blood-glucose meter (TRUE METRIX AIR GLUCOSE METER) Misc 1 each by XX route as directed. DX CODE E11.9 1 each 3  . lancets (TRUEPLUS LANCETS) 28 gauge Misc Use 1 Lancet 2 (two) times daily. DX CODE E11.9 200 each 3  . TRUE METRIX GLUCOSE TEST STRIP test strip USE TO TEST BLOOD SUGAR TWICE DAILY 100 each 5   No current facility-administered medications for this visit.   Allergies: Patient has no known allergies.  Social and Family History  Social History reports that she quit smoking about 4 years ago. Her smoking use included Cigarettes. She has a 55.00 pack-year smoking history. She has never used smokeless tobacco. She reports that she does not drink alcohol or use drugs.  Family History Family History  Problem Relation Age of Onset  . Cancer Mother [id number]  PANCREATIC CANCER  . Myocardial Infarction (Heart attack) Father [id number]  . Hyperlipidemia (Elevated cholesterol) Father  . High blood pressure (Hypertension) Father  . Hyperlipidemia (Elevated cholesterol) Sister  . Diabetes type II Other   Review of Systems  Review of Systems  Constitutional: Negative for chills, diaphoresis, fever, malaise/fatigue and weight loss.  HENT: Negative for congestion, ear discharge, hearing loss and tinnitus.  Eyes: Negative for blurred vision.  Respiratory: Positive for shortness of breath. Negative for cough, hemoptysis, sputum production and wheezing.  Cardiovascular: Negative for chest pain, palpitations, orthopnea, claudication, leg swelling and PND.  Gastrointestinal: Negative for abdominal pain, blood in stool, constipation, diarrhea, heartburn, melena, nausea and vomiting.   Genitourinary: Negative for dysuria, frequency, hematuria and urgency.  Musculoskeletal: Negative for back pain, falls, joint pain and myalgias.  Skin: Negative for itching and rash.  Neurological: Negative for dizziness, tingling, focal weakness, loss of consciousness, weakness and headaches.  Endo/Heme/Allergies: Negative for polydipsia. Does not bruise/bleed easily.  Psychiatric/Behavioral: Negative for depression, memory loss and substance abuse. The patient is not nervous/anxious.   Physical Examination   Vitals:BP 122/60 (BP Location: Right upper arm, Patient Position: Sitting, BP Cuff Size: Adult)  Pulse 72  Resp 14  Ht 165.1 cm (5\' 5" )  Wt 90.7 kg (200 lb)  BMI 33.28 kg/m  Ht:165.1 cm (5\' 5" ) Wt:90.7 kg (200 lb) UJW:JXBJBSA:Body surface area is 2.04 meters squared. Body mass index is 33.28 kg/m.  Wt Readings from Last 3 Encounters:  01/05/17 90.7 kg (200 lb)  12/21/16 95.7 kg (211 lb)  11/03/16 95.7 kg (211 lb)   BP Readings from Last 3 Encounters:  01/05/17 122/60  12/21/16 (!) 140/90  12/17/16 154/89   General appearance appears in no acute distress  Head Mouth and Eye exam Normocephalic, without obvious abnormality, atraumatic Dentition is good Eyes appear anicteric   Neck exam Thyroid: normal  Nodes: no obvious adenopathy  LUNGS Breath Sounds: Normal Percussion: Normal  CARDIOVASCULAR JVP CV wave: no HJR: no Elevation at 90 degrees:  None Carotid Pulse: normal pulsation bilaterally Bruit: None Apex: apical impulse normal  Auscultation Rhythm: normal sinus rhythm S1: normal S2: normal Clicks: no Rub: no Murmurs: 2/6 medium pitched mid systolic blowing at lower left sternal border  Gallop: None ABDOMEN Liver enlargement: no Pulsatile aorta: no Ascites: no Bruits: no  EXTREMITIES Clubbing: no Edema: trace to 1+ bilateral pedal edema Pulses: peripheral pulses symmetrical Femoral Bruits: no Amputation: no SKIN Rash: no Cyanosis:  no Embolic phemonenon: no Bruising: no NEURO Alert and Oriented to person, place and time: yes Non focal: yes  PSYCH: Pt appears to have normal affect  LABS REVIEWED Last 3 CBC results: Lab Results  Component Value Date  WBC 8.9 06/09/2016  WBC 8.7 09/11/2014  WBC 8.1 04/22/2011   Lab Results  Component Value Date  HGB 10.8 (L) 06/09/2016  HGB 12.7 09/11/2014  HGB 11.3 (L) 04/22/2011   Lab Results  Component Value Date  HCT 35.9 06/09/2016  HCT 39.9 09/11/2014  HCT 0.35 04/22/2011   Lab Results  Component Value Date  PLT 321 06/09/2016  PLT 223 09/11/2014  PLT 249 04/22/2011   Lab Results  Component Value Date  CREATININE 1.3 (H) 06/09/2016  BUN 31 (H) 06/09/2016  NA 145 06/09/2016  K 4.2 12/31/2016  CL 99 06/09/2016  CO2 34.9 (H) 06/09/2016   Lab Results  Component Value Date  HGBA1C 6.9 (H) 06/09/2016   Lab Results  Component Value Date  HDL 33.5 (L) 06/09/2016  HDL 41.2 09/11/2014  HDL 44 02/19/2012   Lab Results  Component Value Date  LDLCALC 74 06/09/2016  LDLCALC 84 09/11/2014  LDLCALC 72 02/19/2012   Lab Results  Component Value Date  TRIG 107 06/09/2016  TRIG 136 09/11/2014  TRIG 124 02/19/2012   Lab Results  Component Value Date  ALT 10 06/09/2016  AST 12 06/09/2016  ALKPHOS 85 06/09/2016   Lab Results  Component Value Date  TSH 1.17 04/22/2011   Assessment and Plan   71 y.o. female with  ICD-10-CM ICD-9-CM  1. Hypertension, essential-continue with metoprolol at 200 mg daily and lisinopril 5 mg daily. DASH diet is also recommended. I10 401.9  2. SOB (shortness of breath)-etiology is unclear. May be secondary to her pulmonary disease as she does have significant reactive airway disease. Echo did not show any significant high-grade valvular disease. LV function is mildly reduced which may be playing a mild role. Continue with current regimen although functional study suggest possible anteroseptal ischemia. Will need to further  evaluate with cardiac cath prior to surgery. R06.02 786.05  3. Pure hypercholesterolemia-we will continue with atorvastatin at 80 mg daily with an LDL goal of less than 100. Low-fat diet is also recommended. E78.00 272.0  4. Essential hypertension I10 401.9  5. PAD (peripheral artery disease) (CMS-HCC)-currently stable with dual antiplatelet therapy. Will remain on aspirin and clopidogrel. Smoking cessation and remaining away from tobacco is recommended. I73.9 443.9  6. Coronary artery disease of native artery of native heart with stable angina pectoris (stable clinically however has an abnormal functional study showing anteroseptal ischemia. Cardiac cath will be necessary to risk stratify prior to elective surgery. I25.118 414.01  413.9  7. Renal insufficiency-currently stable. Creatinine is at baseline of 1.3. N28.9 593.9   Return in about 2 weeks (around 01/19/2017).  These notes generated with voice recognition software. I apologize for typographical errors.  Denton Ar, MD    Pt seen and examined. No change from above

## 2017-01-07 ENCOUNTER — Encounter: Payer: Self-pay | Admitting: Cardiology

## 2017-01-12 DIAGNOSIS — J449 Chronic obstructive pulmonary disease, unspecified: Secondary | ICD-10-CM | POA: Diagnosis not present

## 2017-01-13 DIAGNOSIS — I25118 Atherosclerotic heart disease of native coronary artery with other forms of angina pectoris: Secondary | ICD-10-CM | POA: Diagnosis not present

## 2017-01-13 DIAGNOSIS — I10 Essential (primary) hypertension: Secondary | ICD-10-CM | POA: Diagnosis not present

## 2017-01-13 DIAGNOSIS — I739 Peripheral vascular disease, unspecified: Secondary | ICD-10-CM | POA: Diagnosis not present

## 2017-01-25 ENCOUNTER — Inpatient Hospital Stay: Admission: RE | Admit: 2017-01-25 | Payer: Self-pay | Source: Ambulatory Visit

## 2017-01-25 DIAGNOSIS — M25532 Pain in left wrist: Secondary | ICD-10-CM | POA: Diagnosis not present

## 2017-01-25 DIAGNOSIS — J449 Chronic obstructive pulmonary disease, unspecified: Secondary | ICD-10-CM | POA: Diagnosis not present

## 2017-01-28 ENCOUNTER — Encounter
Admission: RE | Admit: 2017-01-28 | Discharge: 2017-01-28 | Disposition: A | Discharge status: Home / Self Care | Payer: Medicare HMO | Source: Ambulatory Visit | Attending: Orthopedic Surgery | Admitting: Orthopedic Surgery

## 2017-01-28 DIAGNOSIS — Z01812 Encounter for preprocedural laboratory examination: Secondary | ICD-10-CM | POA: Diagnosis not present

## 2017-01-28 HISTORY — DX: Dyspnea, unspecified: R06.00

## 2017-01-28 LAB — CBC
HCT: 29.3 % — ABNORMAL LOW (ref 35.0–47.0)
Hemoglobin: 9.1 g/dL — ABNORMAL LOW (ref 12.0–16.0)
MCH: 24.1 pg — AB (ref 26.0–34.0)
MCHC: 31.2 g/dL — AB (ref 32.0–36.0)
MCV: 77.4 fL — AB (ref 80.0–100.0)
PLATELETS: 287 10*3/uL (ref 150–440)
RBC: 3.79 MIL/uL — AB (ref 3.80–5.20)
RDW: 21.5 % — ABNORMAL HIGH (ref 11.5–14.5)
WBC: 6.4 10*3/uL (ref 3.6–11.0)

## 2017-01-28 LAB — POTASSIUM: Potassium: 3.3 mmol/L — ABNORMAL LOW (ref 3.5–5.1)

## 2017-01-28 NOTE — Pre-Procedure Instructions (Signed)
Patient has received cardiac clearance from Dr Lady GaryFath notes in chart. Reviewed patients medical history with DR Henrene HawkingKephart and note from Dr Priscella MannPenwarden from 12/29/16. No new orders.

## 2017-01-28 NOTE — Patient Instructions (Signed)
Your procedure is scheduled on: Wednesday 02/03/17 Report to DAY SURGERY. 2ND FLOOR MEDICAL MALL ENTRANCE. To find out your arrival time please call [phone] between 1PM - 3PM on Tuesday 02/02/17.  Remember: Instructions that are not followed completely may result in serious medical risk, up to and including death, or upon the discretion of your surgeon and anesthesiologist your surgery may need to be rescheduled.    __X__ 1. Do not eat food or drink liquids after midnight. No gum chewing or hard candies.     __X__ 2. No Alcohol for 24 hours before or after surgery.   ____ 3. Bring all medications with you on the day of surgery if instructed.    __X__ 4. Notify your doctor if there is any change in your medical condition     (cold, fever, infections).             __x___5. No smoking within 24 hours of your surgery.     Do not wear jewelry, make-up, hairpins, clips or nail polish.  Do not wear lotions, powders, or perfumes.   Do not shave 48 hours prior to surgery. Men may shave face and neck.  Do not bring valuables to the hospital.    [facility] not [facility] for any belongings or valuables.               Contacts, dentures or bridgework may not be worn into surgery.  Leave your suitcase in the car. After surgery it may be brought to your room.  For patients admitted to the hospital, discharge time is determined by your                treatment team.   Patients discharged the day of surgery will not be allowed to drive home.   Please read over the following fact sheets that you were given:   MRSA Information   __x__ Take these medicines the morning of surgery with A SIP OF WATER:    1. duloxetine  2.  metoprolol  3. May take pain med if needed  4.  5.  6.  ____ Fleet Enema (as directed)   __x__ Use CHG Soap as directed  __x__ Use inhalers on the day of surgery use nebulizer and inhalers prior to arrival and bring rescue inhaler with you  ____ Stop metformin 2  days prior to surgery    ____ Take 1/2 of usual insulin dose the night before surgery and none on the morning of surgery.   __x__ Stop Coumadin/Plavix/aspirin on already stopped  __X__ Stop Anti-inflammatories such as Advil, Aleve, Ibuprofen, Motrin, Naproxen, Naprosyn, Goodies,powder, or aspirin products.  OK to take Tylenol.   ____ Stop supplements until after surgery.    ____ Bring C-Pap to the hospital.

## 2017-02-03 ENCOUNTER — Encounter: Payer: Self-pay | Admitting: Orthopedic Surgery

## 2017-02-03 ENCOUNTER — Encounter
Admission: RE | Disposition: A | Discharge status: Home / Self Care | Payer: Self-pay | Source: Ambulatory Visit | Attending: Orthopedic Surgery

## 2017-02-03 ENCOUNTER — Ambulatory Visit
Admission: RE | Admit: 2017-02-03 | Discharge: 2017-02-03 | Disposition: A | Discharge status: Home / Self Care | Payer: Medicare HMO | Source: Ambulatory Visit | Attending: Orthopedic Surgery | Admitting: Orthopedic Surgery

## 2017-02-03 ENCOUNTER — Ambulatory Visit: Payer: Medicare HMO | Admitting: Anesthesiology

## 2017-02-03 DIAGNOSIS — X58XXXA Exposure to other specified factors, initial encounter: Secondary | ICD-10-CM | POA: Diagnosis not present

## 2017-02-03 DIAGNOSIS — F419 Anxiety disorder, unspecified: Secondary | ICD-10-CM | POA: Insufficient documentation

## 2017-02-03 DIAGNOSIS — I2510 Atherosclerotic heart disease of native coronary artery without angina pectoris: Secondary | ICD-10-CM | POA: Insufficient documentation

## 2017-02-03 DIAGNOSIS — E785 Hyperlipidemia, unspecified: Secondary | ICD-10-CM | POA: Diagnosis not present

## 2017-02-03 DIAGNOSIS — Z9981 Dependence on supplemental oxygen: Secondary | ICD-10-CM | POA: Insufficient documentation

## 2017-02-03 DIAGNOSIS — I10 Essential (primary) hypertension: Secondary | ICD-10-CM | POA: Diagnosis not present

## 2017-02-03 DIAGNOSIS — E1151 Type 2 diabetes mellitus with diabetic peripheral angiopathy without gangrene: Secondary | ICD-10-CM | POA: Insufficient documentation

## 2017-02-03 DIAGNOSIS — Z79899 Other long term (current) drug therapy: Secondary | ICD-10-CM | POA: Diagnosis not present

## 2017-02-03 DIAGNOSIS — S52502A Unspecified fracture of the lower end of left radius, initial encounter for closed fracture: Secondary | ICD-10-CM | POA: Diagnosis not present

## 2017-02-03 DIAGNOSIS — J449 Chronic obstructive pulmonary disease, unspecified: Secondary | ICD-10-CM | POA: Insufficient documentation

## 2017-02-03 DIAGNOSIS — Z9851 Tubal ligation status: Secondary | ICD-10-CM | POA: Diagnosis not present

## 2017-02-03 DIAGNOSIS — Z955 Presence of coronary angioplasty implant and graft: Secondary | ICD-10-CM | POA: Diagnosis not present

## 2017-02-03 DIAGNOSIS — I252 Old myocardial infarction: Secondary | ICD-10-CM | POA: Diagnosis not present

## 2017-02-03 DIAGNOSIS — S52352A Displaced comminuted fracture of shaft of radius, left arm, initial encounter for closed fracture: Secondary | ICD-10-CM | POA: Diagnosis not present

## 2017-02-03 HISTORY — PX: OPEN REDUCTION INTERNAL FIXATION (ORIF) DISTAL RADIAL FRACTURE: SHX5989

## 2017-02-03 LAB — POCT I-STAT 4, (NA,K, GLUC, HGB,HCT)
GLUCOSE: 82 mg/dL (ref 65–99)
HEMATOCRIT: 47 % — AB (ref 36.0–46.0)
HEMOGLOBIN: 16 g/dL — AB (ref 12.0–15.0)
POTASSIUM: 3.9 mmol/L (ref 3.5–5.1)
Sodium: 136 mmol/L (ref 135–145)

## 2017-02-03 LAB — GLUCOSE, CAPILLARY
GLUCOSE-CAPILLARY: 122 mg/dL — AB (ref 65–99)
Glucose-Capillary: 113 mg/dL — ABNORMAL HIGH (ref 65–99)

## 2017-02-03 SURGERY — OPEN REDUCTION INTERNAL FIXATION (ORIF) DISTAL RADIUS FRACTURE
Anesthesia: General | Site: Wrist | Laterality: Left | Wound class: Clean

## 2017-02-03 MED ORDER — ONDANSETRON HCL 4 MG/2ML IJ SOLN: 4.0000 mg | Freq: Once | INTRAMUSCULAR | Status: DC | PRN

## 2017-02-03 MED ORDER — BUPIVACAINE HCL (PF) 0.25 % IJ SOLN
INTRAMUSCULAR | Status: AC
  Filled 2017-02-03: qty 30

## 2017-02-03 MED ORDER — BUPIVACAINE HCL (PF) 0.25 % IJ SOLN
INTRAMUSCULAR | Status: DC | PRN
  Administered 2017-02-03: 10 mL

## 2017-02-03 MED ORDER — CEFAZOLIN SODIUM-DEXTROSE 2-4 GM/100ML-% IV SOLN
2.0000 g | INTRAVENOUS | Status: AC
  Administered 2017-02-03: 2 g via INTRAVENOUS

## 2017-02-03 MED ORDER — PROPOFOL 500 MG/50ML IV EMUL
INTRAVENOUS | Status: AC
  Filled 2017-02-03: qty 50

## 2017-02-03 MED ORDER — METOCLOPRAMIDE HCL 10 MG PO TABS: 5.0000 mg | ORAL_TABLET | Freq: Three times a day (TID) | ORAL | Status: DC | PRN

## 2017-02-03 MED ORDER — LIDOCAINE HCL (PF) 2 % IJ SOLN
INTRAMUSCULAR | Status: AC
  Filled 2017-02-03: qty 2

## 2017-02-03 MED ORDER — FAMOTIDINE 20 MG PO TABS
ORAL_TABLET | ORAL | Status: AC
  Administered 2017-02-03: 20 mg
  Filled 2017-02-03: qty 1

## 2017-02-03 MED ORDER — HYDROCODONE-ACETAMINOPHEN 5-325 MG PO TABS: 1.0000 | ORAL_TABLET | ORAL | Status: DC | PRN

## 2017-02-03 MED ORDER — FENTANYL CITRATE (PF) 100 MCG/2ML IJ SOLN
INTRAMUSCULAR | Status: AC
  Filled 2017-02-03: qty 2

## 2017-02-03 MED ORDER — KETAMINE HCL 50 MG/ML IJ SOLN
INTRAMUSCULAR | Status: AC
  Filled 2017-02-03: qty 10

## 2017-02-03 MED ORDER — PROPOFOL 500 MG/50ML IV EMUL: INTRAVENOUS | Status: DC | PRN

## 2017-02-03 MED ORDER — NEOMYCIN-POLYMYXIN B GU 40-200000 IR SOLN
Status: DC | PRN
  Administered 2017-02-03: 2 mL

## 2017-02-03 MED ORDER — CEFAZOLIN SODIUM-DEXTROSE 2-4 GM/100ML-% IV SOLN
INTRAVENOUS | Status: AC
  Filled 2017-02-03: qty 100

## 2017-02-03 MED ORDER — HYDROCODONE-ACETAMINOPHEN 5-325 MG PO TABS: 1.0000 | ORAL_TABLET | ORAL | 0 refills | Status: AC | PRN

## 2017-02-03 MED ORDER — ROPIVACAINE HCL 5 MG/ML IJ SOLN
INTRAMUSCULAR | Status: AC
  Filled 2017-02-03: qty 30

## 2017-02-03 MED ORDER — ACETAMINOPHEN 10 MG/ML IV SOLN
INTRAVENOUS | Status: DC | PRN
  Administered 2017-02-03: 1000 mg via INTRAVENOUS

## 2017-02-03 MED ORDER — CHLORHEXIDINE GLUCONATE 4 % EX LIQD: 60.0000 mL | Freq: Once | CUTANEOUS | Status: DC

## 2017-02-03 MED ORDER — NEOMYCIN-POLYMYXIN B GU 40-200000 IR SOLN
Status: AC
  Filled 2017-02-03: qty 2

## 2017-02-03 MED ORDER — KETAMINE HCL 50 MG/ML IJ SOLN
INTRAMUSCULAR | Status: DC | PRN
  Administered 2017-02-03 (×2): 12.5 mg via INTRAMUSCULAR

## 2017-02-03 MED ORDER — METOCLOPRAMIDE HCL 5 MG/ML IJ SOLN: 5.0000 mg | Freq: Three times a day (TID) | INTRAMUSCULAR | Status: DC | PRN

## 2017-02-03 MED ORDER — ONDANSETRON HCL 4 MG/2ML IJ SOLN: 4.0000 mg | Freq: Four times a day (QID) | INTRAMUSCULAR | Status: DC | PRN

## 2017-02-03 MED ORDER — MIDAZOLAM HCL 2 MG/2ML IJ SOLN
INTRAMUSCULAR | Status: AC
  Administered 2017-02-03: 0.5 mg via INTRAVENOUS
  Filled 2017-02-03: qty 2

## 2017-02-03 MED ORDER — ROPIVACAINE HCL 5 MG/ML IJ SOLN
INTRAMUSCULAR | Status: DC | PRN
  Administered 2017-02-03: 15 mL via EPIDURAL

## 2017-02-03 MED ORDER — ONDANSETRON HCL 4 MG PO TABS: 4.0000 mg | ORAL_TABLET | Freq: Four times a day (QID) | ORAL | Status: DC | PRN

## 2017-02-03 MED ORDER — FENTANYL CITRATE (PF) 100 MCG/2ML IJ SOLN
25.0000 ug | Freq: Once | INTRAMUSCULAR | Status: AC
  Administered 2017-02-03: 25 ug via INTRAVENOUS

## 2017-02-03 MED ORDER — SODIUM CHLORIDE 0.9 % IV SOLN
INTRAVENOUS | Status: DC
  Administered 2017-02-03: 16:00:00 via INTRAVENOUS

## 2017-02-03 MED ORDER — MIDAZOLAM HCL 2 MG/2ML IJ SOLN
0.5000 mg | Freq: Once | INTRAMUSCULAR | Status: AC
  Administered 2017-02-03: 0.5 mg via INTRAVENOUS

## 2017-02-03 MED ORDER — MIDAZOLAM HCL 2 MG/2ML IJ SOLN
INTRAMUSCULAR | Status: AC
  Filled 2017-02-03: qty 2

## 2017-02-03 MED ORDER — FENTANYL CITRATE (PF) 100 MCG/2ML IJ SOLN: 25.0000 ug | INTRAMUSCULAR | Status: DC | PRN

## 2017-02-03 MED ORDER — FENTANYL CITRATE (PF) 100 MCG/2ML IJ SOLN
INTRAMUSCULAR | Status: AC
  Administered 2017-02-03: 25 ug via INTRAVENOUS
  Filled 2017-02-03: qty 2

## 2017-02-03 MED ORDER — ACETAMINOPHEN 10 MG/ML IV SOLN
INTRAVENOUS | Status: AC
  Filled 2017-02-03: qty 100

## 2017-02-03 MED ORDER — MIDAZOLAM HCL 2 MG/2ML IJ SOLN
INTRAMUSCULAR | Status: DC | PRN
  Administered 2017-02-03: .25 mg via INTRAVENOUS
  Administered 2017-02-03: 1 mg via INTRAVENOUS

## 2017-02-03 SURGICAL SUPPLY — 44 items
BANDAGE ACE 3X5.8 VEL STRL LF (GAUZE/BANDAGES/DRESSINGS) ×3 IMPLANT
BNDG ESMARK 4X12 TAN STRL LF (GAUZE/BANDAGES/DRESSINGS) ×3 IMPLANT
BRUSH SCRUB 4% CHG (MISCELLANEOUS) ×3 IMPLANT
CAST PADDING 3X4FT ST 30246 (SOFTGOODS) ×2
CLOSURE WOUND 1/4X4 (GAUZE/BANDAGES/DRESSINGS) ×1
CUFF TOURN 18 STER (MISCELLANEOUS) IMPLANT
DRAPE C-ARM XRAY 36X54 (DRAPES) ×3 IMPLANT
DRAPE FLUOR MINI C-ARM 54X84 (DRAPES) ×3 IMPLANT
DRSG DERMACEA 8X12 NADH (GAUZE/BANDAGES/DRESSINGS) ×3 IMPLANT
DURAPREP 26ML APPLICATOR (WOUND CARE) ×3 IMPLANT
ELECT REM PT RETURN 9FT ADLT (ELECTROSURGICAL) ×3
ELECTRODE REM PT RTRN 9FT ADLT (ELECTROSURGICAL) ×1 IMPLANT
GAUZE SPONGE 4X4 12PLY STRL (GAUZE/BANDAGES/DRESSINGS) ×3 IMPLANT
GLOVE BIOGEL M STRL SZ7.5 (GLOVE) ×3 IMPLANT
GLOVE INDICATOR 8.0 STRL GRN (GLOVE) ×3 IMPLANT
GOWN STRL REUS W/ TWL LRG LVL3 (GOWN DISPOSABLE) ×2 IMPLANT
GOWN STRL REUS W/TWL LRG LVL3 (GOWN DISPOSABLE) ×4
KIT RM TURNOVER STRD PROC AR (KITS) ×3 IMPLANT
NEEDLE FILTER BLUNT 18X 1/2SAF (NEEDLE) ×2
NEEDLE FILTER BLUNT 18X1 1/2 (NEEDLE) ×1 IMPLANT
NS IRRIG 500ML POUR BTL (IV SOLUTION) ×3 IMPLANT
PACK EXTREMITY ARMC (MISCELLANEOUS) ×3 IMPLANT
PAD CAST CTTN 3X4 STRL (SOFTGOODS) ×1 IMPLANT
PADDING CAST BLEND 3X4 NS (MISCELLANEOUS) ×1 IMPLANT
PADDING CAST BLEND 3X4YD NS (MISCELLANEOUS) ×2
PEG FULLY THREADED 2.5X22MM (Peg) ×3 IMPLANT
PLATE STAN 24.4X59.5 LT (Plate) ×3 IMPLANT
SCREW BN 12X3.5XNS CORT TI (Screw) ×3 IMPLANT
SCREW CORT 3.5X10 LNG (Screw) ×3 IMPLANT
SCREW CORT 3.5X12 (Screw) ×6 IMPLANT
SCREW PEG LOCK 2.5X18 (Peg) ×6 IMPLANT
SCREW PEG LOCK 2.5X20 (Peg) ×9 IMPLANT
SOL PREP PVP 2OZ (MISCELLANEOUS) ×3
SOLUTION PREP PVP 2OZ (MISCELLANEOUS) ×1 IMPLANT
SPLINT CAST 1 STEP 3X12 (MISCELLANEOUS) ×3 IMPLANT
STOCKINETTE STRL 4IN 9604848 (GAUZE/BANDAGES/DRESSINGS) ×3 IMPLANT
STRIP CLOSURE SKIN 1/4X4 (GAUZE/BANDAGES/DRESSINGS) ×2 IMPLANT
SUT ETHILON 5-0 FS-2 18 BLK (SUTURE) ×3 IMPLANT
SUT VIC AB 2-0 SH 27 (SUTURE) ×2
SUT VIC AB 2-0 SH 27XBRD (SUTURE) ×1 IMPLANT
SUT VIC AB 4-0 FS2 27 (SUTURE) ×3 IMPLANT
SYR 5ML LL (SYRINGE) ×3 IMPLANT
WIRE Z .045 C-WIRE SPADE TIP (WIRE) ×3 IMPLANT
WIRE Z .062 C-WIRE SPADE TIP (WIRE) ×3 IMPLANT

## 2017-02-03 NOTE — Anesthesia Postprocedure Evaluation (Signed)
Anesthesia Post Note  Patient: Marlane HatcherSandra J Haire  Procedure(s) Performed: Procedure(s) (LRB): OPEN REDUCTION INTERNAL FIXATION (ORIF) DISTAL RADIAL FRACTURE (Left)  Patient location during evaluation: PACU Anesthesia Type: Regional Level of consciousness: awake and alert Pain management: pain level controlled Vital Signs Assessment: post-procedure vital signs reviewed and stable Respiratory status: spontaneous breathing, nonlabored ventilation, respiratory function stable and patient connected to nasal cannula oxygen Cardiovascular status: stable and blood pressure returned to baseline Anesthetic complications: no     Last Vitals:  Vitals:   02/03/17 2153 02/03/17 2159  BP: 112/62   Pulse: 66 70  Resp: (!) 22 20  Temp:      Last Pain:  Vitals:   02/03/17 1527  TempSrc: Oral  PainSc: 0-No pain                 Alaena Strader S

## 2017-02-03 NOTE — Transfer of Care (Signed)
Immediate Anesthesia Transfer of Care Note  Patient: Marlane HatcherSandra J Mcgillis  Procedure(s) Performed: Procedure(s): OPEN REDUCTION INTERNAL FIXATION (ORIF) DISTAL RADIAL FRACTURE (Left)  Patient Location: PACU  Anesthesia Type:Regional  Level of Consciousness: awake, alert  and oriented  Airway & Oxygen Therapy: Patient Spontanous Breathing and Patient connected to face mask oxygen  Post-op Assessment: Report given to RN and Post -op Vital signs reviewed and stable  Post vital signs: Reviewed and stable  Last Vitals:  Vitals:   02/03/17 1624 02/03/17 1629  BP: 130/86   Pulse: (!) 55 64  Resp: (!) 26 19  Temp:      Last Pain:  Vitals:   02/03/17 1527  TempSrc: Oral  PainSc: 0-No pain         Complications: No apparent anesthesia complications

## 2017-02-03 NOTE — Anesthesia Post-op Follow-up Note (Cosign Needed)
Anesthesia QCDR form completed.        

## 2017-02-03 NOTE — Anesthesia Procedure Notes (Signed)
Anesthesia Procedure Note 15ml of 0,5% ropivicaine used. The lower dose was used as she has severe COPD.

## 2017-02-03 NOTE — Discharge Instructions (Signed)
AMBULATORY SURGERY  °DISCHARGE INSTRUCTIONS ° ° °1) The drugs that you were given will stay in your system until tomorrow so for the next 24 hours you should not: ° °A) Drive an automobile °B) Make any legal decisions °C) Drink any alcoholic beverage ° ° °2) You may resume regular meals tomorrow.  Today it is better to start with liquids and gradually work up to solid foods. ° °You may eat anything you prefer, but it is better to start with liquids, then soup and crackers, and gradually work up to solid foods. ° ° °3) Please notify your doctor immediately if you have any unusual bleeding, trouble breathing, redness and pain at the surgery site, drainage, fever, or pain not relieved by medication. ° ° ° °4) Additional Instructions: ° ° ° ° ° ° ° °Please contact your physician with any problems or Same Day Surgery at [phone], Monday through Friday 6 am to 4 pm, or [place] at  Main number at [phone]. ° °Instructions after Hand / Wrist Surgery ° ° James P. Hooten, Jr., M.D. ° Dept. of Orthopaedics & Sports Medicine ° [facility] ° [address] ° [place], [place]  [address] ° ° Phone: [phone]   Fax: [phone] ° ° °DIET: °• Drink plenty of non-alcoholic fluids & begin a light diet. °• Resume your normal diet the day after surgery. ° °ACTIVITY:  °• Keep the hand elevated above the level of the elbow. °• Begin gently moving the fingers on a regular basis to avoid stiffness. °• Avoid any heavy lifting, pushing, or pulling with the operative hand. °• Do not drive or operate any equipment until instructed. ° °WOUND CARE:  °• Keep the splint/bandage clean and dry.  °• The splint and stitches will be removed in the office. °• Continue to use the ice packs periodically to reduce pain and swelling. °• You may bathe or shower after the stitches are removed at the first office visit following surgery. ° °MEDICATIONS: °• You may resume your regular medications. °• Please take the pain medication  as prescribed. °• Do not take pain medication on an empty stomach. °• Do not drive or drink alcoholic beverages when taking pain medications. ° °CALL THE OFFICE FOR: °• Temperature above 101 degrees °• Excessive bleeding or drainage on the dressing. °• Excessive swelling, coldness, or paleness of the fingers. °• Persistent nausea and vomiting. ° °FOLLOW-UP:  °• You should have an appointment to return to the office in 7-10 days after surgery.  ° °REMEMBER: R.I.C.E. = Rest, Ice, Compression, Elevation !  °

## 2017-02-03 NOTE — Anesthesia Preprocedure Evaluation (Addendum)
Anesthesia Evaluation  Patient identified by MRN, date of birth, ID band Patient awake    Reviewed: Allergy & Precautions, NPO status , Patient's Chart, lab work & pertinent test results, reviewed documented beta blocker date and time   Airway Mallampati: II  TM Distance: >3 FB     Dental  (+) Chipped   Pulmonary shortness of breath and with exertion, COPD,  COPD inhaler, former smoker,           Cardiovascular hypertension, Pt. on medications and Pt. on home beta blockers + CAD, + Past MI and + Peripheral Vascular Disease       Neuro/Psych Anxiety    GI/Hepatic Neg liver ROS, diverticulosis   Endo/Other  diabetes, Well Controlled, Type 2, Oral Hypoglycemic Agents  Renal/GU Renal InsufficiencyRenal disease  negative genitourinary   Musculoskeletal   Abdominal   Peds negative pediatric ROS (+)  Hematology  (+) anemia ,   Anesthesia Other Findings Past Medical History: No date: Anxiety No date: COPD (chronic obstructive pulmonary disease) (* No date: Coronary artery disease No date: Diabetes (HCC) No date: Diverticulosis No date: Dyspnea No date: Hyperlipidemia No date: Hypertension 2000: Myocardial infarction (HCC) No date: On home oxygen therapy     Comment: 2L/M No date: Peripheral vascular disease (HCC)  Reproductive/Obstetrics                            Anesthesia Physical Anesthesia Plan  ASA: III  Anesthesia Plan: Regional   Post-op Pain Management:    Induction: Intravenous  PONV Risk Score and Plan: 3 and Ondansetron, Dexamethasone, Propofol, Midazolam and Treatment may vary due to age or medical condition  Airway Management Planned: Nasal Cannula  Additional Equipment:   Intra-op Plan:   Post-operative Plan:   Informed Consent: I have reviewed the patients History and Physical, chart, labs and discussed the procedure including the risks, benefits and  alternatives for the proposed anesthesia with the patient or authorized representative who has indicated his/her understanding and acceptance.   Dental advisory given  Plan Discussed with: CRNA and Surgeon  Anesthesia Plan Comments: (Talked with patient about Pulmonary Dept.s worry about Pulmonary status and wishes to do Regional block over General.   Patient agrees to a supraclavicular block for the surgical anesthesia along with IV sedation.  Risks and benefits were discussed and patient agrees to procedure.)      Anesthesia Quick Evaluation

## 2017-02-03 NOTE — Anesthesia Procedure Notes (Signed)
Anesthesia Regional Block: Supraclavicular block  Narrative:

## 2017-02-03 NOTE — OR Nursing (Signed)
Patient transferred to PACU for nerve block. Report to Telford NabEve Sharpe RN.

## 2017-02-03 NOTE — H&P (Signed)
The patient has been re-examined, and the chart reviewed, and there have been no interval changes to the documented history and physical.    The risks, benefits, and alternatives have been discussed at length. The patient expressed understanding of the risks benefits and agreed with plans for surgical intervention.  James P. Hooten, Jr. M.D.    

## 2017-02-03 NOTE — Anesthesia Procedure Notes (Signed)
Date/Time: 02/03/2017 4:45 PM Performed by: Junious SilkNOLES, Dearies Meikle Pre-anesthesia Checklist: Patient identified, Emergency Drugs available, Suction available, Patient being monitored and Timeout performed Oxygen Delivery Method: Simple face mask

## 2017-02-03 NOTE — Op Note (Signed)
OPERATIVE NOTE  DATE OF SURGERY:  02/03/2017  PATIENT NAME:  [name]   DOB: 09/26/1945  MRN: [id number]  PRE-OPERATIVE DIAGNOSIS: Left distal radius fracture  POST-OPERATIVE DIAGNOSIS:  Same  PROCEDURE:  Open reduction and internal fixation of the left distal radius fracture  SURGEON:  Jena GaussJames P Talulah Schirmer, Jr. M.D.  ANESTHESIA: regional  ESTIMATED BLOOD LOSS: Minimal  FLUIDS REPLACED: 800 mL of crystalloid  TOURNIQUET TIME: 1 - 124 minutes 2 - 19 minutes  DRAINS: None  IMPLANTS UTILIZED: [employer] DVRA volar plate, 6 - 7.8GN2.5mm threaded pegs, 4 - 3.205mm cortical screws  INDICATIONS FOR SURGERY: [name] is a 71 y.o. year old female who sustained a left distal radius fracture. After discussion of the risks and benefits of surgical intervention, the patient expressed understanding of the risks benefits and agree with plans for open reduction and internal fixation.   PROCEDURE IN DETAIL: The patient was brought into the operating room and after adequate regional anesthesia, a tourniquet was placed on the patient's left upper arm.The left hand and arm were prepped with alcohol and Duraprep and draped in the usual sterile fashion. A "time-out" was performed as per usual protocol. The hand and forearm were exsanguinated using an Esmarch and the tourniquet was inflated to 250 mmHg. Loupe magnification was used throughout the procedure. A volar longitudinal incision was made in line with the flexor carpi radialis tendon. The FCR tendon was reflected in a radial fashion and the floor of the tendon sheath was incised. Dissection was carried down to the pronator quadratus. The pronator quadratus was incised in an L-shaped fashion and elevated off of the volar surface of the distal radius. The fracture site was visualized. Some callus formation and fibrotic tissue was debrided to allow for provisional reduction of the fracture.  A [employer] DVRA volar plate was provisionally positioned  and stabilized using K wires. Reduction of the fracture and position of the implant were visualized in multiple planes using the FluoroScan. A 3.5 mm cortical screw was placed in the slotted position. The tourniquet was deflated after initial tourniquet time of 124 minutes. Next, 3 fully threaded 2.5 mm threaded pegs were placed in the proximal row with position confirmed using the FluoroScan. In a similar fashion, 3 fully threaded 2.5 mm threaded pegs were placed in the distal row. The tourniquet was reinflated. Finally, the proximal portion of the plate was additionally stabilized with 3 additional 3.5 mm cortical screws. The wound was irrigated with copious amounts of normal saline with antibiotic solution. The pronator quadratus was repaired over the plate using #5-6#2-0 Vicryl. Tourniquet was deflated after second tourniquet time of 19 minutes. Good hemostasis was appreciated. The subcutaneous tissue was approximated with interrupted sutures of #2-0 Vicryl and #4-0 Vicryl. The skin was closed with skin staples. Steri-Strips were applied. 0.25% Marcaine was injected along the incision site. A sterile dressing was applied followed by application of a volar splint.   The patient tolerated the procedure well and was transported to the PACU in stable condition.  Sherol Sabas P. Angie FavaHooten, Jr., M.D.

## 2017-02-04 ENCOUNTER — Encounter: Payer: Self-pay | Admitting: Orthopedic Surgery

## 2017-02-11 DIAGNOSIS — J449 Chronic obstructive pulmonary disease, unspecified: Secondary | ICD-10-CM | POA: Diagnosis not present

## 2017-02-12 DIAGNOSIS — M25532 Pain in left wrist: Secondary | ICD-10-CM | POA: Diagnosis not present

## 2017-02-24 DIAGNOSIS — J449 Chronic obstructive pulmonary disease, unspecified: Secondary | ICD-10-CM | POA: Diagnosis not present

## 2017-03-01 DIAGNOSIS — M25532 Pain in left wrist: Secondary | ICD-10-CM | POA: Diagnosis not present

## 2017-03-14 DIAGNOSIS — J449 Chronic obstructive pulmonary disease, unspecified: Secondary | ICD-10-CM | POA: Diagnosis not present

## 2017-03-25 DIAGNOSIS — J019 Acute sinusitis, unspecified: Secondary | ICD-10-CM | POA: Diagnosis not present

## 2017-03-25 DIAGNOSIS — I10 Essential (primary) hypertension: Secondary | ICD-10-CM | POA: Diagnosis not present

## 2017-03-25 DIAGNOSIS — R413 Other amnesia: Secondary | ICD-10-CM | POA: Diagnosis not present

## 2017-03-25 DIAGNOSIS — R42 Dizziness and giddiness: Secondary | ICD-10-CM | POA: Diagnosis not present

## 2017-03-25 DIAGNOSIS — R55 Syncope and collapse: Secondary | ICD-10-CM | POA: Diagnosis not present

## 2017-03-25 DIAGNOSIS — J449 Chronic obstructive pulmonary disease, unspecified: Secondary | ICD-10-CM | POA: Diagnosis not present

## 2017-03-25 DIAGNOSIS — E119 Type 2 diabetes mellitus without complications: Secondary | ICD-10-CM | POA: Diagnosis not present

## 2017-03-25 DIAGNOSIS — D649 Anemia, unspecified: Secondary | ICD-10-CM | POA: Diagnosis not present

## 2017-03-25 DIAGNOSIS — R296 Repeated falls: Secondary | ICD-10-CM | POA: Diagnosis not present

## 2017-03-27 DIAGNOSIS — J449 Chronic obstructive pulmonary disease, unspecified: Secondary | ICD-10-CM | POA: Diagnosis not present

## 2017-04-02 DIAGNOSIS — M25532 Pain in left wrist: Secondary | ICD-10-CM | POA: Diagnosis not present

## 2017-04-08 ENCOUNTER — Telehealth: Payer: Self-pay | Admitting: Pulmonary Disease

## 2017-04-08 NOTE — Telephone Encounter (Signed)
l mom to call and schedule appt for pulmonologist for COPD.

## 2017-04-10 DEATH — deceased

## 2017-04-16 NOTE — Telephone Encounter (Signed)
Patient was called 8/30 by [place]. Letter Sent to patient    Referral for COPD per Dr. Burnett ShengHedrick

## 2017-04-16 NOTE — Telephone Encounter (Signed)
LMOV to call office.   

## 2018-02-27 IMAGING — DX DG WRIST COMPLETE 3+V*L*
4 series · 4 of 4 positions shown · non-contrast
Comparison: None in PACs

CLINICAL DATA: Wrist pain and deformity after a fall at home this
morning.

EXAM:
LEFT WRIST - COMPLETE 3+ VIEW

[wrist ap (1 of 2)]
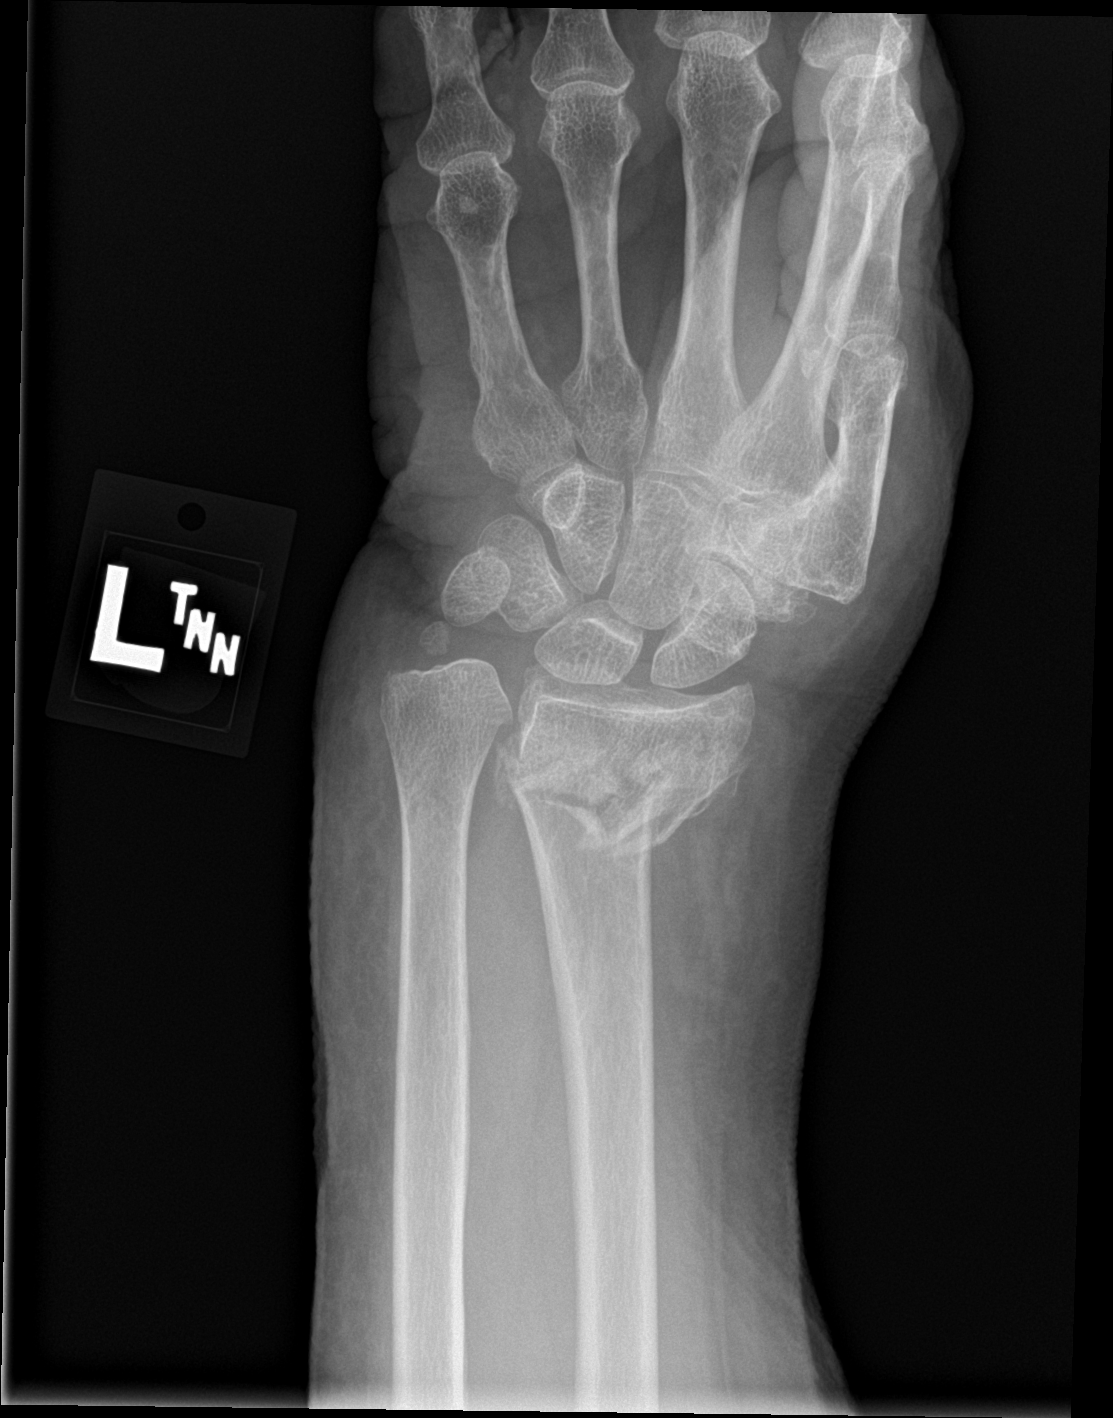

[wrist obl]
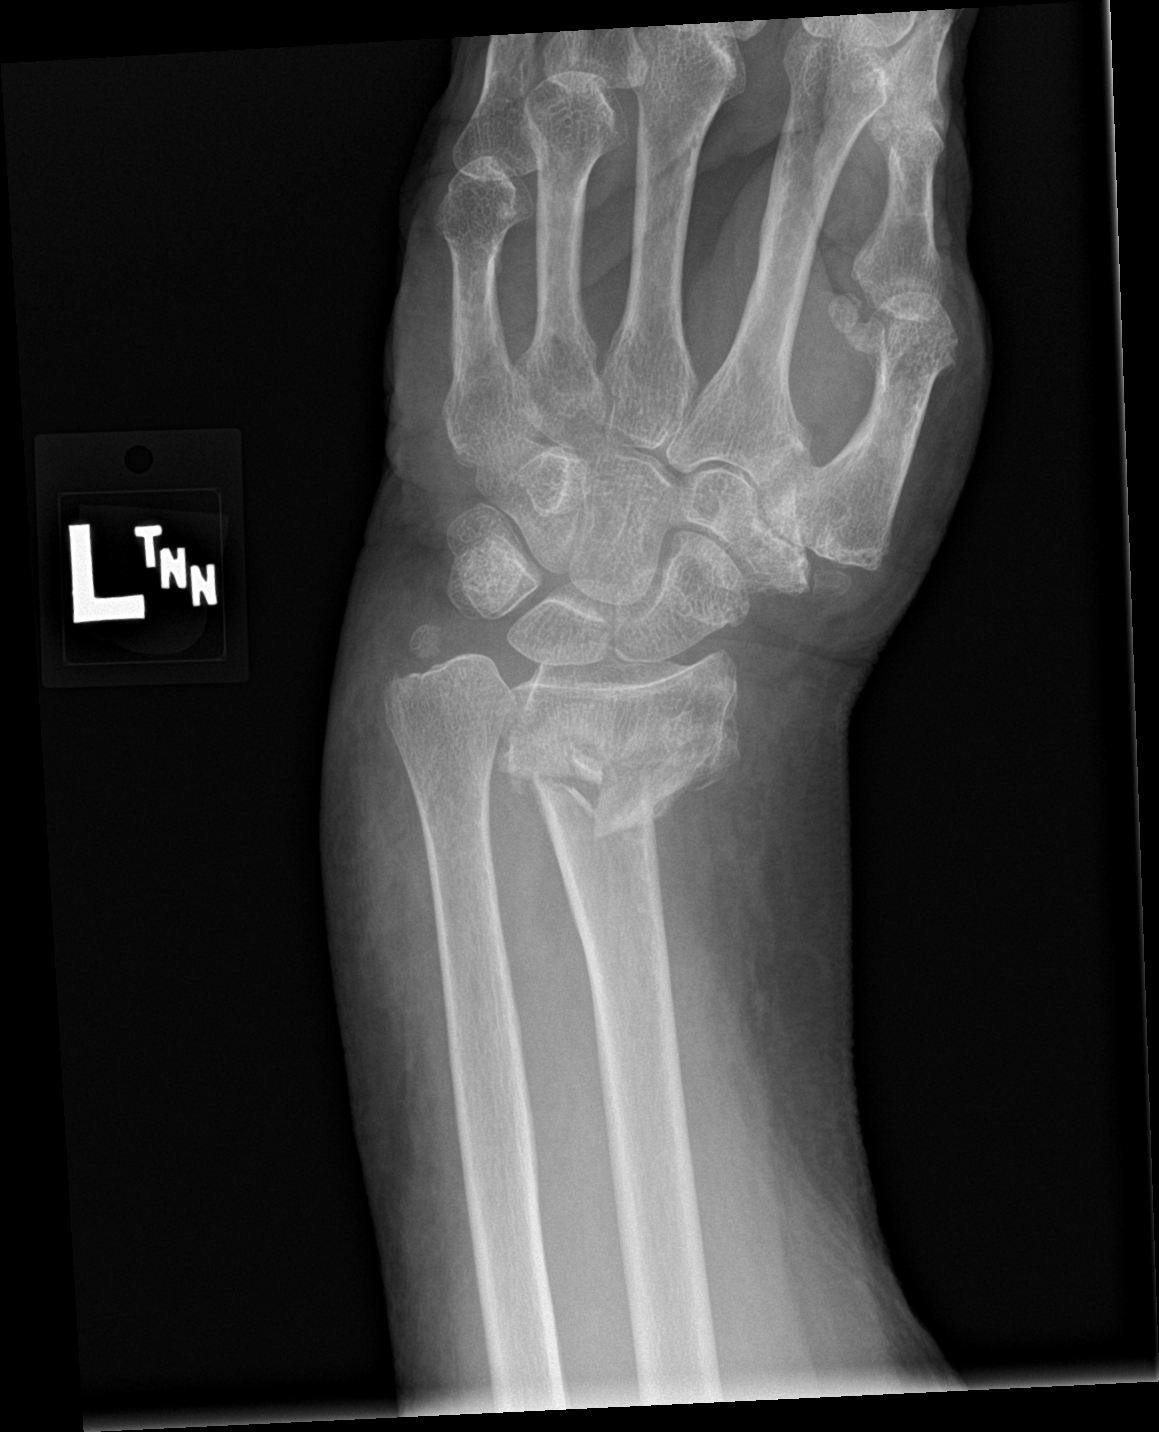

[wrist lat]
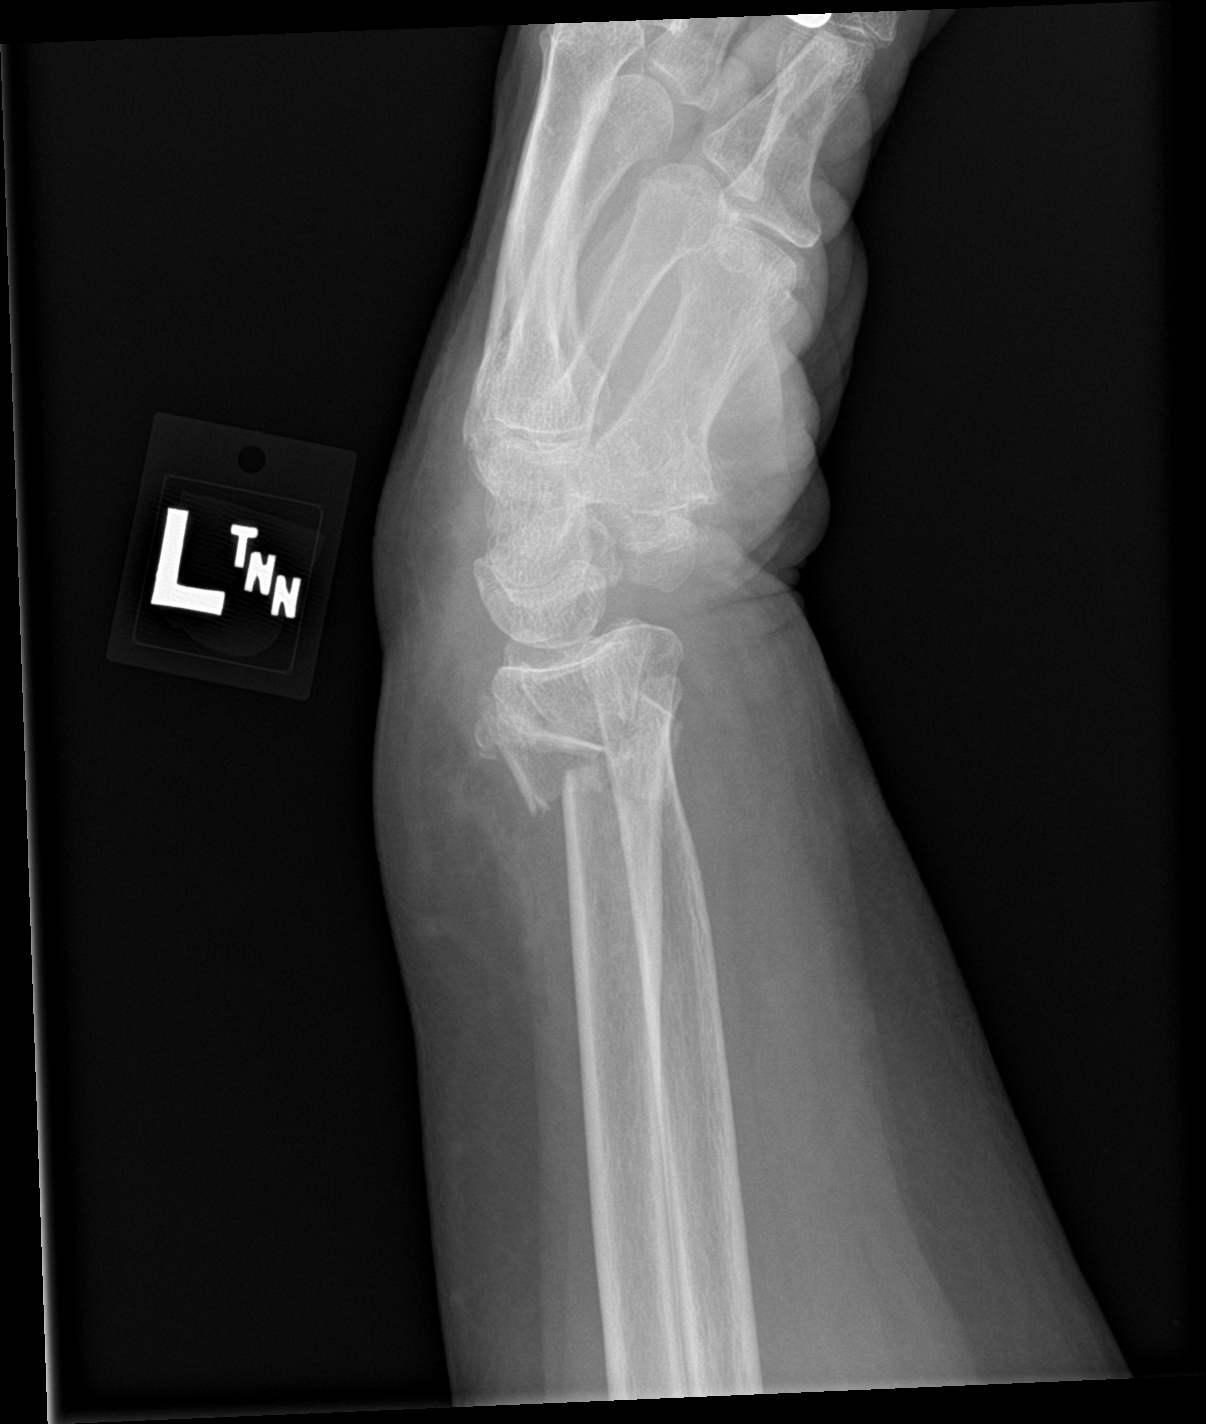

[wrist ap (2 of 2)]
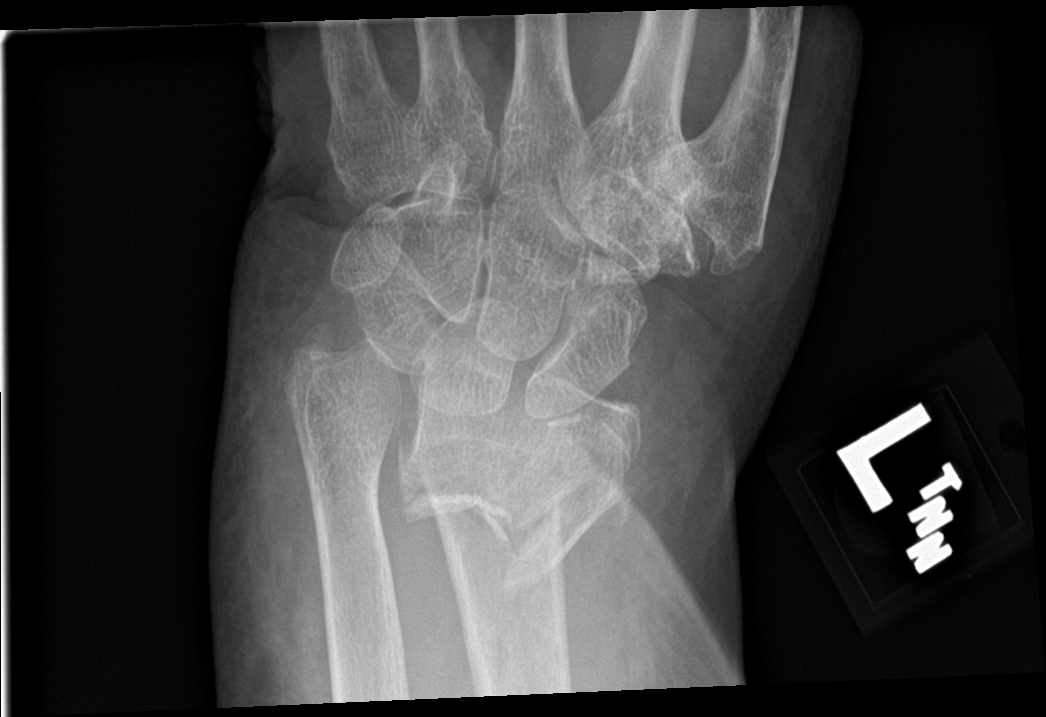

[4 of 4 positions shown; findings below may reference images not displayed]

FINDINGS: The patient has sustained an acute comminuted angulated fracture of
the distal left radial metaphysis. There is an ulnar styloid
fracture of uncertain age. The carpal bones appear intact. There is
moderate degenerative change of the first CMC joint.
IMPRESSION: There is an acute angulated comminuted fracture of the distal left
radial metaphysis. There is an ulnar styloid fracture of uncertain
age.

Moderate degenerative change of the first CMC joint.
